# Patient Record
Sex: Female | Born: 1960
Health system: Southern US, Community
[De-identification: ages and names within clinical notes are randomized; demographics above are authoritative.]

## PROBLEM LIST (undated history)

## (undated) DIAGNOSIS — J4520 Mild intermittent asthma, uncomplicated: Secondary | ICD-10-CM

## (undated) DIAGNOSIS — J309 Allergic rhinitis, unspecified: Secondary | ICD-10-CM

## (undated) DIAGNOSIS — E7800 Pure hypercholesterolemia, unspecified: Secondary | ICD-10-CM

## (undated) DIAGNOSIS — J45909 Unspecified asthma, uncomplicated: Secondary | ICD-10-CM

## (undated) DIAGNOSIS — I10 Essential (primary) hypertension: Secondary | ICD-10-CM

## (undated) DIAGNOSIS — R7303 Prediabetes: Secondary | ICD-10-CM

## (undated) DIAGNOSIS — E782 Mixed hyperlipidemia: Secondary | ICD-10-CM

## (undated) HISTORY — DX: Mild intermittent asthma, uncomplicated: J45.20

## (undated) HISTORY — DX: Essential (primary) hypertension: I10

## (undated) HISTORY — DX: Prediabetes: R73.03

## (undated) HISTORY — PX: SHOULDER ARTHROSCOPY: SHX128

## (undated) HISTORY — DX: Allergic rhinitis, unspecified: J30.9

## (undated) HISTORY — PX: HAMMER TOE SURGERY: SHX385

## (undated) HISTORY — DX: Unspecified asthma, uncomplicated: J45.909

## (undated) HISTORY — PX: CHOLECYSTECTOMY: SHX55

## (undated) HISTORY — DX: Pure hypercholesterolemia, unspecified: E78.00

## (undated) HISTORY — DX: Mixed hyperlipidemia: E78.2

## (undated) HISTORY — PX: BUNIONECTOMY: SHX129

---

## 2002-12-01 ENCOUNTER — Emergency Department (HOSPITAL_COMMUNITY): Admission: EM | Admit: 2002-12-01 | Discharge: 2002-12-01 | Payer: Self-pay | Admitting: Emergency Medicine

## 2002-12-01 ENCOUNTER — Encounter: Payer: Self-pay | Admitting: Emergency Medicine

## 2005-04-11 ENCOUNTER — Emergency Department (HOSPITAL_COMMUNITY): Admission: EM | Admit: 2005-04-11 | Discharge: 2005-04-11 | Payer: Self-pay | Admitting: Family Medicine

## 2015-11-23 LAB — HM COLONOSCOPY

## 2016-12-25 LAB — HM MAMMOGRAPHY

## 2017-05-25 ENCOUNTER — Ambulatory Visit: Payer: Self-pay | Admitting: Allergy

## 2017-06-25 ENCOUNTER — Encounter: Payer: Self-pay | Admitting: Allergy

## 2017-06-25 ENCOUNTER — Ambulatory Visit (INDEPENDENT_AMBULATORY_CARE_PROVIDER_SITE_OTHER): Payer: Self-pay | Admitting: Allergy

## 2017-06-25 VITALS — BP 110/70 | HR 80 | Temp 98.3°F | Resp 16 | Ht 68.25 in | Wt 164.0 lb

## 2017-06-25 DIAGNOSIS — J3089 Other allergic rhinitis: Secondary | ICD-10-CM

## 2017-06-25 DIAGNOSIS — Z91018 Allergy to other foods: Secondary | ICD-10-CM

## 2017-06-25 DIAGNOSIS — J4530 Mild persistent asthma, uncomplicated: Secondary | ICD-10-CM

## 2017-06-25 DIAGNOSIS — K219 Gastro-esophageal reflux disease without esophagitis: Secondary | ICD-10-CM

## 2017-06-25 MED ORDER — BUDESONIDE-FORMOTEROL FUMARATE 80-4.5 MCG/ACT IN AERO: 2.0000 | INHALATION_SPRAY | Freq: Two times a day (BID) | RESPIRATORY_TRACT | 5 refills | Status: DC

## 2017-06-25 MED ORDER — ALBUTEROL SULFATE HFA 108 (90 BASE) MCG/ACT IN AERS: 2.0000 | INHALATION_SPRAY | RESPIRATORY_TRACT | 4 refills | Status: DC | PRN

## 2017-06-25 MED ORDER — AZELASTINE HCL 0.1 % NA SOLN: 2.0000 | Freq: Two times a day (BID) | NASAL | 4 refills | Status: DC

## 2017-06-25 NOTE — Progress Notes (Signed)
New Patient Note  RE: Brittany Abbott MRN: [id number] DOB: 1960-11-27 Date of Office Visit: 06/25/2017  Referring provider: No ref. provider found Primary care provider: Patient, No Pcp Per  Chief Complaint: cough  History of present illness: Brittany Abbott is a 56 y.o. female presenting today for evaluation of cough for years.  She has a history of asthma, allergic rhinitis and food allergy.  She is a former patient of Dr. Ishmael Holter however she has not been seen in over 3 years.  She reports past 1.5 year cough has been worse.  Cough is during day and night.  Sometimes cough is productive but most time it is dry cough.  She reports she does get into "spasms" of cough where it seems like she can't stop.  She denies any wheezing or chest tightness.  She does throat clear and states she has a lot of PND.  She takes Symbicort (and goes back and forth between Advair) now for the past year.  She takes 1 puff twice a day.  She does have an albuterol inhaler and reports symptoms were worse in July and used about 23-24 puffs over the course of the month.   She does wake up from sleep with cough most nights and can wake multiple times a night.      She feels the antihistamines she use to take dried her out too much (zyrtec, allegra, claritin) thus she tries to avoid taking these medications.  She denies significant nasal congestion.  She reports she has used nasal saline rinse before.  She has used nasocort and flonase nasal sprays states that she could taste them.  She has used astelin many many years ago.   She recalls being allergic to grass, mold, dust, trees (pine, oak) in the past.    She has been paying attention to her foods as she feels she may have some reflux components.  She doesn't eat after 7:30.  She also reports cold impacts her reflux symptoms thus she does not drink any beverages with ice.  She has a shellfish allergy and avoids lobster, shrimp, crab.  She is able to eat oysters without any issue.   She states her previous crustacean ingestion she has developed lip itchiness.  She recalls her testing to the crustaceans to be a 3 or 4+ on skin testing. She has a very outdated epinephrine device.   She moved back here to [place] about a year ago from [place].  She travels a lot with her work across the country.    Review of systems: Review of Systems  Constitutional: Negative for chills, fever and malaise/fatigue.  HENT: Negative for congestion, ear discharge, ear pain, nosebleeds, sinus pain, sore throat and tinnitus.   Eyes: Negative for pain, discharge and redness.  Respiratory: Positive for cough and sputum production. Negative for shortness of breath and wheezing.   Cardiovascular: Negative for chest pain.  Gastrointestinal: Positive for heartburn. Negative for abdominal pain, constipation, diarrhea, nausea and vomiting.  Musculoskeletal: Negative for joint pain.  Skin: Negative for itching and rash.  Neurological: Negative for headaches.    All other systems negative unless noted above in HPI  Past medical history: Past Medical History:  Diagnosis Date  . Allergic rhinitis   . Asthma     Past surgical history: Past Surgical History:  Procedure Laterality Date  . BUNIONECTOMY    . CHOLECYSTECTOMY    . HAMMER TOE SURGERY      Family history:  Family History  Problem Relation Age of Onset  . Allergic rhinitis Neg Hx   . Asthma Neg Hx   . Eczema Neg Hx     Social history: She lives in a town house with carpeting with gas and electric heating and central cooling. There are no dogs inside the house. There is a dog outside the house. There is no concern for water damage, mildew or roaches in the home. She works as an Architectural technologist. She has no smoking history.   Medication List: Allergies as of 06/25/2017      Reactions   Shellfish Allergy Anaphylaxis      Medication List       Accurate as of 06/25/17 12:24 PM. Always use your  most recent med list.          albuterol 108 (90 Base) MCG/ACT inhaler Commonly known as:  VENTOLIN HFA Inhale 2 puffs into the lungs every 4 (four) hours as needed for wheezing or shortness of breath.   azelastine 0.1 % nasal spray Commonly known as:  ASTELIN Place 2 sprays into both nostrils 2 (two) times daily. Use in each nostril as directed   budesonide-formoterol 80-4.5 MCG/ACT inhaler Commonly known as:  SYMBICORT Inhale 2 puffs into the lungs 2 (two) times daily.   triamterene-hydrochlorothiazide 37.5-25 MG tablet Commonly known as:  MAXZIDE-25 Take 1 tablet by mouth daily.       Known medication allergies: Allergies  Allergen Reactions  . Shellfish Allergy Anaphylaxis     Physical examination: Blood pressure 110/70, pulse 80, temperature 98.3 F (36.8 C), temperature source Oral, resp. rate 16, height 5' 8.25" (1.734 m), weight 164 lb (74.4 kg).  General: Alert, interactive, in no acute distress. HEENT: PERRLA, TMs pearly gray, turbinates mildly edematous with crusty discharge, post-pharynx non erythematous. Neck: Supple without lymphadenopathy. Lungs: Clear to auscultation without wheezing, rhonchi or rales. {no increased work of breathing. CV: Normal S1, S2 without murmurs. Abdomen: Nondistended, nontender. Skin: Warm and dry, without lesions or rashes. Extremities:  No clubbing, cyanosis or edema. Neuro:   Grossly intact.  Diagnositics/Labs:  Spirometry: FEV1: 2.67L  110%, FVC: 3.08L  102%, ratio consistent with Nonobstructive pattern  Allergy testing: Environmental skin prick testing was positive for [place] blue grass, Timothy grass, dust mites and cockroach. Allergy testing results were read and interpreted by provider, documented by clinical staff.   Assessment and plan:   Cough with history of asthma    - current cough likely multifactorial with Post-nasal drainage, reflux and asthma.      - increase Symbicort to 2 puffs twice a day    -  have access to albuterol inhaler 2 puffs every 4-6 hours as needed for cough/wheeze/shortness of breath/chest tightness.  May use 15-20 minutes prior to activity.   Monitor frequency of use.    Asthma control goals:   Full participation in all desired activities (may need albuterol before activity)  Albuterol use two time or less a week on average (not counting use with activity)  Cough interfering with sleep two time or less a month  Oral steroids no more than once a year  No hospitalizations  Allergic rhinitis     - likely large component of cough     - environmental allergy testing is positive for grasses, dust mites and cockroach.  Allergen avoidance measures discussed and dust mite cover info provided.      - trial use of Dymista (combo spray with Flonast and Astelin) 1 spray each nostril twice a  day.  Once this run out use Astelin separately.       - recommend use of Astelin (nasal antihistamine) 2 sprays each nostril for control of nasal drainage  Food Allergy    - continue avoidance of shellfish (crustaceans- shrimp, lobster, crab)    - have access to self-injectable epinephrine Brittany Abbott) 0.3mg  at all times    - follow emergency action plan in case of allergic reaction  Reflux    - continue lifestyle modifications    - she would like to minimize amount of medications she takes and will do above measures and if remains with cough will initiate anti-reflux medication  Follow-up 4-6 months-- Let us know how symptoms are doing by phone  I appreciate the opportunity to take part in Abagael's care. Please do not hesitate to contact me with questions.  Sincerely,   Brittany Feeler, MD Allergy/Immunology [facility]

## 2017-06-25 NOTE — Patient Instructions (Addendum)
Cough with history of asthma    - current cough likely multifactorial with Post-nasal drainage, reflux and asthma.      - increase Symbicort to 2 puffs twice a day    - have access to albuterol inhaler 2 puffs every 4-6 hours as needed for cough/wheeze/shortness of breath/chest tightness.  May use 15-20 minutes prior to activity.   Monitor frequency of use.    Asthma control goals:   Full participation in all desired activities (may need albuterol before activity)  Albuterol use two time or less a week on average (not counting use with activity)  Cough interfering with sleep two time or less a month  Oral steroids no more than once a year  No hospitalizations  Allergic rhinitis     - likely large component of cough     - environmental allergy testing is positive for grasses, dust mites and cockroach.  Allergen avoidance measures discussed and dust mite cover info provided.      - trial use of Dymista (combo spray with Flonast and Astelin) 1 spray each nostril twice a day.  Once this run out use Astelin separately.       - recommend use of Astelin (nasal antihistamine) 2 sprays each nostril for control of nasal drainage  Food Allergy    - continue avoidance of shellfish (crustaceans- shrimp, lobster, crab)    - have access to self-injectable epinephrine Wynona Luna) 0.3mg  at all times    - follow emergency action plan in case of allergic reaction  Follow-up 4-6 months-- Let us know how symptoms are doing by phone

## 2017-07-16 ENCOUNTER — Encounter: Payer: Self-pay | Admitting: Family Medicine

## 2017-07-16 ENCOUNTER — Ambulatory Visit (INDEPENDENT_AMBULATORY_CARE_PROVIDER_SITE_OTHER): Payer: No Typology Code available for payment source | Admitting: Family Medicine

## 2017-07-16 VITALS — BP 120/72 | HR 72 | Ht 68.5 in | Wt 160.2 lb

## 2017-07-16 DIAGNOSIS — E782 Mixed hyperlipidemia: Secondary | ICD-10-CM

## 2017-07-16 DIAGNOSIS — Z87898 Personal history of other specified conditions: Secondary | ICD-10-CM | POA: Diagnosis not present

## 2017-07-16 DIAGNOSIS — J4520 Mild intermittent asthma, uncomplicated: Secondary | ICD-10-CM | POA: Diagnosis not present

## 2017-07-16 DIAGNOSIS — E7800 Pure hypercholesterolemia, unspecified: Secondary | ICD-10-CM

## 2017-07-16 DIAGNOSIS — I10 Essential (primary) hypertension: Secondary | ICD-10-CM

## 2017-07-16 HISTORY — DX: Pure hypercholesterolemia, unspecified: E78.00

## 2017-07-16 MED ORDER — TRIAMTERENE-HCTZ 37.5-25 MG PO TABS: 1.0000 | ORAL_TABLET | Freq: Every day | ORAL | 1 refills | Status: DC

## 2017-07-16 NOTE — Progress Notes (Signed)
   Subjective:    Patient ID: Brittany Abbott, female    DOB: 1961-02-24, 56 y.o.   MRN: [id number]  HPI Chief Complaint  Patient presents with  . new pt    new pt, get established. no other cocerns. declines flu shot. needs refill on bp med. had recent lab work done at obgyn Dr. Nyoka Cowden   She is new to the practice and here to establish care. States she moved here in August 2017 from [place]. Dr. Ola Spurr in [place] was her PCP.  States she had an exam and labs done at Dr. Rolly Salter office in January 2018.   Last CPE: January 2017.   Other providers: Dr. Nyoka Cowden is her OB/GYN. Dr. Nelva Bush is allergist.   Past medical history: HTN- diagnosed at age 28. Well controlled.  Mixed hyperlipidemia  Pre-diabetes in January 2017.  Asthma- diagnosed in HS. Exercise induced and allergies. No asthma flares in over a year.   Social history: Lives alone. Divorced. Works as an Occupational hygienist and travels for work.  Diet: reports healthy diet. She likes to bake  Excerise: some days   Health maintenance:  Mammogram: up to date Colonoscopy: due in 2020 Last Gynecological Exam: up to date Last Menstrual cycle: irregular and started 2 days ago.   Depression screen PHQ 2/9 07/16/2017  Decreased Interest 0  Down, Depressed, Hopeless 0  PHQ - 2 Score 0    Reviewed allergies, medications, past medical, surgical, family, and social history.   Review of Systems Pertinent positives and negatives in the history of present illness.     Objective:   Physical Exam BP 120/72   Pulse 72   Ht 5' 8.5" (1.74 m)   Wt 160 lb 3.2 oz (72.7 kg)   LMP 07/14/2017   BMI 24.00 kg/m   Alert and oriented and in no acute distress. Not otherwise examined.       Assessment & Plan:  Essential hypertension - Plan: triamterene-hydrochlorothiazide (MAXZIDE-25) 37.5-25 MG tablet  Mixed hyperlipidemia  History of prediabetes  Mild intermittent asthma without complication   Discussed that her BP is well  controlled, no issues or concerns regarding medication, medication refilled.  Asthma appears to be well controlled. She is compliant with medication. No recent flares. She will continue seeing her allergist.  Discussed healthy diet and exercise for HTN, prediabetes and hyperlipidemia. She has never taken medication for cholesterol. Needs to have this repeated.  She would like to return in the next few weeks for a fasting CPE.

## 2017-07-20 ENCOUNTER — Encounter: Payer: Self-pay | Admitting: Family Medicine

## 2017-10-08 ENCOUNTER — Other Ambulatory Visit: Payer: Self-pay | Admitting: Allergy

## 2017-10-08 DIAGNOSIS — J3089 Other allergic rhinitis: Secondary | ICD-10-CM

## 2017-10-08 MED ORDER — AZELASTINE HCL 0.1 % NA SOLN: 2.0000 | Freq: Two times a day (BID) | NASAL | 0 refills | Status: DC

## 2017-10-08 NOTE — Telephone Encounter (Signed)
Received fax for 90 day supply for azelastine. Patient was last seen on 06/25/2017. Refill sent in.

## 2017-11-06 HISTORY — PX: ROTATOR CUFF REPAIR: SHX139

## 2017-11-24 ENCOUNTER — Emergency Department: Payer: PRIVATE HEALTH INSURANCE

## 2017-11-24 ENCOUNTER — Encounter: Payer: Self-pay | Admitting: Emergency Medicine

## 2017-11-24 ENCOUNTER — Emergency Department
Admission: EM | Admit: 2017-11-24 | Discharge: 2017-11-24 | Disposition: A | Discharge status: Home / Self Care | Payer: PRIVATE HEALTH INSURANCE | Attending: Emergency Medicine | Admitting: Emergency Medicine

## 2017-11-24 DIAGNOSIS — M7918 Myalgia, other site: Secondary | ICD-10-CM | POA: Insufficient documentation

## 2017-11-24 DIAGNOSIS — M542 Cervicalgia: Secondary | ICD-10-CM | POA: Diagnosis present

## 2017-11-24 DIAGNOSIS — I10 Essential (primary) hypertension: Secondary | ICD-10-CM | POA: Diagnosis not present

## 2017-11-24 DIAGNOSIS — Z79899 Other long term (current) drug therapy: Secondary | ICD-10-CM | POA: Insufficient documentation

## 2017-11-24 DIAGNOSIS — M5412 Radiculopathy, cervical region: Secondary | ICD-10-CM

## 2017-11-24 DIAGNOSIS — J45909 Unspecified asthma, uncomplicated: Secondary | ICD-10-CM | POA: Diagnosis not present

## 2017-11-24 MED ORDER — CYCLOBENZAPRINE HCL 10 MG PO TABS: 10.0000 mg | ORAL_TABLET | Freq: Three times a day (TID) | ORAL | 0 refills | Status: DC | PRN

## 2017-11-24 MED ORDER — NAPROXEN 500 MG PO TABS: 500.0000 mg | ORAL_TABLET | Freq: Two times a day (BID) | ORAL | 0 refills | Status: DC

## 2017-11-24 NOTE — Discharge Instructions (Signed)
Please follow up with your primary care provider if you are not improving over the week.  Return to the ER for symptoms that change or worsen if unable to schedule an appointment.
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
## 2017-11-26 ENCOUNTER — Telehealth: Payer: Self-pay
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
## 2017-11-27 ENCOUNTER — Ambulatory Visit (INDEPENDENT_AMBULATORY_CARE_PROVIDER_SITE_OTHER): Payer: No Typology Code available for payment source | Admitting: Family Medicine

## 2017-11-27 ENCOUNTER — Encounter: Payer: Self-pay | Admitting: Family Medicine

## 2017-11-27 VITALS — BP 130/84 | HR 76 | Ht 69.0 in | Wt 161.6 lb

## 2017-11-27 DIAGNOSIS — M25512 Pain in left shoulder: Secondary | ICD-10-CM

## 2017-11-27 DIAGNOSIS — M542 Cervicalgia: Secondary | ICD-10-CM | POA: Diagnosis not present

## 2017-11-27 DIAGNOSIS — M25511 Pain in right shoulder: Secondary | ICD-10-CM

## 2017-11-27 NOTE — Patient Instructions (Addendum)
Start the naproxen and take it twice daily with food (if it bothers your stomach, cut in half).  Do not use other OTC pain relieves, but Tylenol IS okay (the only one you can take along with this medication). It may take a few days for the naproxen to fully kick in.  Continue it until your pain has completely resolved, or up to 2 weeks. If you aren't better by 2 weeks, you may need physical therapy.  Consider [employer] for heat when you are driving (when you don't have access to a heating pad).  Do the range of motion exercises for the shoulders as shown. Use the muscle relaxant at bedtime if pain interferes with sleep. Use with extreme caution during the day (take only 1/2 tab, and don't drive until you know if it makes you sleepy).  You can use the muscle relaxant only if needed for severe pain in the muscle. Heat, massage, stretches will also help.  Hold each exercise for 10 seconds, repeat 10 times, and do them twice daily.  Shoulder Exercises Ask your health care provider which exercises are safe for you. Do exercises exactly as told by your health care provider and adjust them as directed. It is normal to feel mild stretching, pulling, tightness, or discomfort as you do these exercises, but you should stop right away if you feel sudden pain or your pain gets worse.Do not begin these exercises until told by your health care provider. RANGE OF MOTION EXERCISES These exercises warm up your muscles and joints and improve the movement and flexibility of your shoulder. These exercises also help to relieve pain, numbness, and tingling. These exercises involve stretching your injured shoulder directly. Exercise A: Pendulum  1. Stand near a wall or a surface that you can hold onto for balance. 2. Bend at the waist and let your left / right arm hang straight down. Use your other arm to support you. Keep your back straight and do not lock your knees. 3. Relax your left / right arm and shoulder  muscles, and move your hips and your trunk so your left / right arm swings freely. Your arm should swing because of the motion of your body, not because you are using your arm or shoulder muscles. 4. Keep moving your body so your arm swings in the following directions, as told by your health care provider: ? Side to side. ? Forward and backward. ? In clockwise and counterclockwise circles. 5. Continue each motion for __________ seconds, or for as long as told by your health care provider. 6. Slowly return to the starting position. Repeat __________ times. Complete this exercise __________ times a day. Exercise B:Flexion, Standing  1. Stand and hold a broomstick, a cane, or a similar object. Place your hands a little more than shoulder-width apart on the object. Your left / right hand should be palm-up, and your other hand should be palm-down. 2. Keep your elbow straight and keep your shoulder muscles relaxed. Push the stick down with your healthy arm to raise your left / right arm in front of your body, and then over your head until you feel a stretch in your shoulder. ? Avoid shrugging your shoulder while you raise your arm. Keep your shoulder blade tucked down toward the middle of your back. 3. Hold for __________ seconds. 4. Slowly return to the starting position. Repeat __________ times. Complete this exercise __________ times a day. Exercise C: Abduction, Standing 1. Stand and hold a broomstick, a cane,  or a similar object. Place your hands a little more than shoulder-width apart on the object. Your left / right hand should be palm-up, and your other hand should be palm-down. 2. While keeping your elbow straight and your shoulder muscles relaxed, push the stick across your body toward your left / right side. Raise your left / right arm to the side of your body and then over your head until you feel a stretch in your shoulder. ? Do not raise your arm above shoulder height, unless your health  care provider tells you to do that. ? Avoid shrugging your shoulder while you raise your arm. Keep your shoulder blade tucked down toward the middle of your back. 3. Hold for __________ seconds. 4. Slowly return to the starting position. Repeat __________ times. Complete this exercise __________ times a day. Exercise D:Internal Rotation  1. Place your left / right hand behind your back, palm-up. 2. Use your other hand to dangle an exercise band, a towel, or a similar object over your shoulder. Grasp the band with your left / right hand so you are holding onto both ends. 3. Gently pull up on the band until you feel a stretch in the front of your left / right shoulder. ? Avoid shrugging your shoulder while you raise your arm. Keep your shoulder blade tucked down toward the middle of your back. 4. Hold for __________ seconds. 5. Release the stretch by letting go of the band and lowering your hands. Repeat __________ times. Complete this exercise __________ times a day. STRETCHING EXERCISES These exercises warm up your muscles and joints and improve the movement and flexibility of your shoulder. These exercises also help to relieve pain, numbness, and tingling. These exercises are done using your healthy shoulder to help stretch the muscles of your injured shoulder. Exercise E: Warehouse manager (External Rotation and Abduction)  1. Stand in a doorway with one of your feet slightly in front of the other. This is called a staggered stance. If you cannot reach your forearms to the door frame, stand facing a corner of a room. 2. Choose one of the following positions as told by your health care provider: ? Place your hands and forearms on the door frame above your head. ? Place your hands and forearms on the door frame at the height of your head. ? Place your hands on the door frame at the height of your elbows. 3. Slowly move your weight onto your front foot until you feel a stretch across your chest  and in the front of your shoulders. Keep your head and chest upright and keep your abdominal muscles tight. 4. Hold for __________ seconds. 5. To release the stretch, shift your weight to your back foot. Repeat __________ times. Complete this stretch __________ times a day. Exercise F:Extension, Standing 1. Stand and hold a broomstick, a cane, or a similar object behind your back. ? Your hands should be a little wider than shoulder-width apart. ? Your palms should face away from your back. 2. Keeping your elbows straight and keeping your shoulder muscles relaxed, move the stick away from your body until you feel a stretch in your shoulder. ? Avoid shrugging your shoulders while you move the stick. Keep your shoulder blade tucked down toward the middle of your back. 3. Hold for __________ seconds. 4. Slowly return to the starting position. Repeat __________ times. Complete this exercise __________ times a day. STRENGTHENING EXERCISES These exercises build strength and endurance in your shoulder. Endurance  is the ability to use your muscles for a long time, even after they get tired. Exercise G:External Rotation  1. Sit in a stable chair without armrests. 2. Secure an exercise band at elbow height on your left / right side. 3. Place a soft object, such as a folded towel or a small pillow, between your left / right upper arm and your body to move your elbow a few inches away (about 10 cm) from your side. 4. Hold the end of the band so it is tight and there is no slack. 5. Keeping your elbow pressed against the soft object, move your left / right forearm out, away from your abdomen. Keep your body steady so only your forearm moves. 6. Hold for __________ seconds. 7. Slowly return to the starting position. Repeat __________ times. Complete this exercise __________ times a day. Exercise H:Shoulder Abduction  1. Sit in a stable chair without armrests, or stand. 2. Hold a __________ weight in  your left / right hand, or hold an exercise band with both hands. 3. Start with your arms straight down and your left / right palm facing in, toward your body. 4. Slowly lift your left / right hand out to your side. Do not lift your hand above shoulder height unless your health care provider tells you that this is safe. ? Keep your arms straight. ? Avoid shrugging your shoulder while you do this movement. Keep your shoulder blade tucked down toward the middle of your back. 5. Hold for __________ seconds. 6. Slowly lower your arm, and return to the starting position. Repeat __________ times. Complete this exercise __________ times a day. Exercise I:Shoulder Extension 1. Sit in a stable chair without armrests, or stand. 2. Secure an exercise band to a stable object in front of you where it is at shoulder height. 3. Hold one end of the exercise band in each hand. Your palms should face each other. 4. Straighten your elbows and lift your hands up to shoulder height. 5. Step back, away from the secured end of the exercise band, until the band is tight and there is no slack. 6. Squeeze your shoulder blades together as you pull your hands down to the sides of your thighs. Stop when your hands are straight down by your sides. Do not let your hands go behind your body. 7. Hold for __________ seconds. 8. Slowly return to the starting position. Repeat __________ times. Complete this exercise __________ times a day. Exercise J:Standing Shoulder Row 1. Sit in a stable chair without armrests, or stand. 2. Secure an exercise band to a stable object in front of you so it is at waist height. 3. Hold one end of the exercise band in each hand. Your palms should be in a thumbs-up position. 4. Bend each of your elbows to an "L" shape (about 90 degrees) and keep your upper arms at your sides. 5. Step back until the band is tight and there is no slack. 6. Slowly pull your elbows back behind you. 7. Hold for  __________ seconds. 8. Slowly return to the starting position. Repeat __________ times. Complete this exercise __________ times a day. Exercise K:Shoulder Press-Ups  1. Sit in a stable chair that has armrests. Sit upright, with your feet flat on the floor. 2. Put your hands on the armrests so your elbows are bent and your fingers are pointing forward. Your hands should be about even with the sides of your body. 3. Push down on the armrests  and use your arms to lift yourself off of the chair. Straighten your elbows and lift yourself up as much as you comfortably can. ? Move your shoulder blades down, and avoid letting your shoulders move up toward your ears. ? Keep your feet on the ground. As you get stronger, your feet should support less of your body weight as you lift yourself up. 4. Hold for __________ seconds. 5. Slowly lower yourself back into the chair. Repeat __________ times. Complete this exercise __________ times a day. Exercise L: Wall Push-Ups  1. Stand so you are facing a stable wall. Your feet should be about one arm-length away from the wall. 2. Lean forward and place your palms on the wall at shoulder height. 3. Keep your feet flat on the floor as you bend your elbows and lean forward toward the wall. 4. Hold for __________ seconds. 5. Straighten your elbows to push yourself back to the starting position. Repeat __________ times. Complete this exercise __________ times a day. This information is not intended to replace advice given to you by your health care provider. Make sure you discuss any questions you have with your health care provider. Document Released: 09/06/2005 Document Revised: 07/17/2016 Document Reviewed: 07/04/2015 Elsevier Interactive Patient Education  2018 [place].     Neck Exercises Neck exercises can be important for many reasons:  They can help you to improve and maintain flexibility in your neck. This can be especially important as you  age.  They can help to make your neck stronger. This can make movement easier.  They can reduce or prevent neck pain.  They may help your upper back.  Ask your health care provider which neck exercises would be best for you. Exercises Neck Press Repeat this exercise 10 times. Do it first thing in the morning and right before bed or as told by your health care provider. 1. Lie on your back on a firm bed or on the floor with a pillow under your head. 2. Use your neck muscles to push your head down on the pillow and straighten your spine. 3. Hold the position as well as you can. Keep your head facing up and your chin tucked. 4. Slowly count to 5 while holding this position. 5. Relax for a few seconds. Then repeat.  Isometric Strengthening Do a full set of these exercises 2 times a day or as told by your health care provider. 1. Sit in a supportive chair and place your hand on your forehead. 2. Push forward with your head and neck while pushing back with your hand. Hold for 10 seconds. 3. Relax. Then repeat the exercise 3 times. 4. Next, do thesequence again, this time putting your hand against the back of your head. Use your head and neck to push backward against the hand pressure. 5. Finally, do the same exercise on either side of your head, pushing sideways against the pressure of your hand.  Prone Head Lifts Repeat this exercise 5 times. Do this 2 times a day or as told by your health care provider. 1. Lie face-down, resting on your elbows so that your chest and upper back are raised. 2. Start with your head facing downward, near your chest. Position your chin either on or near your chest. 3. Slowly lift your head upward. Lift until you are looking straight ahead. Then continue lifting your head as far back as you can stretch. 4. Hold your head up for 5 seconds. Then slowly lower it to  your starting position.  Supine Head Lifts Repeat this exercise 8-10 times. Do this 2 times a day  or as told by your health care provider. 1. Lie on your back, bending your knees to point to the ceiling and keeping your feet flat on the floor. 2. Lift your head slowly off the floor, raising your chin toward your chest. 3. Hold for 5 seconds. 4. Relax and repeat.  Scapular Retraction Repeat this exercise 5 times. Do this 2 times a day or as told by your health care provider. 1. Stand with your arms at your sides. Look straight ahead. 2. Slowly pull both shoulders backward and downward until you feel a stretch between your shoulder blades in your upper back. 3. Hold for 10-30 seconds. 4. Relax and repeat.  Contact a health care provider if:  Your neck pain or discomfort gets much worse when you do an exercise.  Your neck pain or discomfort does not improve within 2 hours after you exercise. If you have any of these problems, stop exercising right away. Do not do the exercises again unless your health care provider says that you can. Get help right away if:  You develop sudden, severe neck pain. If this happens, stop exercising right away. Do not do the exercises again unless your health care provider says that you can. Exercises Neck Stretch  Repeat this exercise 3-5 times. 1. Do this exercise while standing or while sitting in a chair. 2. Place your feet flat on the floor, shoulder-width apart. 3. Slowly turn your head to the right. Turn it all the way to the right so you can look over your right shoulder. Do not tilt or tip your head. 4. Hold this position for 10-30 seconds. 5. Slowly turn your head to the left, to look over your left shoulder. 6. Hold this position for 10-30 seconds.  Neck Retraction Repeat this exercise 8-10 times. Do this 3-4 times a day or as told by your health care provider. 1. Do this exercise while standing or while sitting in a sturdy chair. 2. Look straight ahead. Do not bend your neck. 3. Use your fingers to push your chin backward. Do not bend  your neck for this movement. Continue to face straight ahead. If you are doing the exercise properly, you will feel a slight sensation in your throat and a stretch at the back of your neck. 4. Hold the stretch for 1-2 seconds. Relax and repeat.  This information is not intended to replace advice given to you by your health care provider. Make sure you discuss any questions you have with your health care provider. Document Released: 10/04/2015 Document Revised: 03/30/2016 Document Reviewed: 05/03/2015 Elsevier Interactive Patient Education  [employer].

## 2017-12-12 ENCOUNTER — Ambulatory Visit (INDEPENDENT_AMBULATORY_CARE_PROVIDER_SITE_OTHER): Payer: No Typology Code available for payment source | Admitting: Family Medicine

## 2017-12-12 ENCOUNTER — Encounter: Payer: Self-pay | Admitting: Family Medicine

## 2017-12-12 VITALS — BP 122/86 | HR 80 | Ht 69.0 in | Wt 162.0 lb

## 2017-12-12 DIAGNOSIS — M62838 Other muscle spasm: Secondary | ICD-10-CM | POA: Diagnosis not present

## 2017-12-12 DIAGNOSIS — M25511 Pain in right shoulder: Secondary | ICD-10-CM | POA: Diagnosis not present

## 2017-12-12 DIAGNOSIS — M542 Cervicalgia: Secondary | ICD-10-CM

## 2017-12-12 DIAGNOSIS — M25512 Pain in left shoulder: Secondary | ICD-10-CM

## 2017-12-12 DIAGNOSIS — G5602 Carpal tunnel syndrome, left upper limb: Secondary | ICD-10-CM | POA: Diagnosis not present

## 2017-12-12 MED ORDER — METAXALONE 800 MG PO TABS: 400.0000 mg | ORAL_TABLET | Freq: Three times a day (TID) | ORAL | 0 refills | Status: DC | PRN

## 2017-12-12 MED ORDER — NAPROXEN 500 MG PO TABS: 500.0000 mg | ORAL_TABLET | Freq: Two times a day (BID) | ORAL | 0 refills | Status: DC
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
## 2017-12-12 NOTE — Patient Instructions (Signed)
Continue to use the naproxen twice daily with food until getting relief (for another 2 weeks at most). Stop if it starts upsetting your stomach. Continue heat, massage and stretches. We discussed proper posture--avoid looking down. There may be a component of carpal tunnel syndrome (mostly noted on the left during the exam)--I recommend a trial of the wrist braces (found at any pharmacy). We are changing the muscle relaxant to one that is less sedating than the flexeril.  Use with caution, until you know how it makes you feel. We are referring you to physical therapy at [facility].  If you don't get a phone call in the next couple of days, you can either call there directly, or let us know and we can help.  If there is somewhere more convenient for you, just let us know the name/number.  If you have persistent pain, we may need to send you to an orthopedist for further evaluation. Wrist braces, poss CTS  \\ Carpal Tunnel Syndrome Carpal tunnel syndrome is a condition that causes pain in your hand and arm. The carpal tunnel is a narrow area located on the palm side of your wrist. Repeated wrist motion or certain diseases may cause swelling within the tunnel. This swelling pinches the main nerve in the wrist (median nerve). What are the causes? This condition may be caused by:  Repeated wrist motions.  Wrist injuries.  Arthritis.  A cyst or tumor in the carpal tunnel.  Fluid buildup during pregnancy.  Sometimes the cause of this condition is not known. What increases the risk? This condition is more likely to develop in:  People who have jobs that cause them to repeatedly move their wrists in the same motion, such as Art gallery manager.  Women.  People with certain conditions, such as: ? Diabetes. ? Obesity. ? An underactive thyroid (hypothyroidism). ? Kidney failure.  What are the signs or symptoms? Symptoms of this condition include:  A tingling feeling in your  fingers, especially in your thumb, index, and middle fingers.  Tingling or numbness in your hand.  An aching feeling in your entire arm, especially when your wrist and elbow are bent for long periods of time.  Wrist pain that goes up your arm to your shoulder.  Pain that goes down into your palm or fingers.  A weak feeling in your hands. You may have trouble grabbing and holding items.  Your symptoms may feel worse during the night. How is this diagnosed? This condition is diagnosed with a medical history and physical exam. You may also have tests, including:  An electromyogram (EMG). This test measures electrical signals sent by your nerves into the muscles.  X-rays.  How is this treated? Treatment for this condition includes:  Lifestyle changes. It is important to stop doing or modify the activity that caused your condition.  Physical or occupational therapy.  Medicines for pain and inflammation. This may include medicine that is injected into your wrist.  A wrist splint.  Surgery.  Follow these instructions at home: If you have a splint:  Wear it as told by your health care provider. Remove it only as told by your health care provider.  Loosen the splint if your fingers become numb and tingle, or if they turn cold and blue.  Keep the splint clean and dry. General instructions  Take over-the-counter and prescription medicines only as told by your health care provider.  Rest your wrist from any activity that may be causing your pain.  If your condition is work related, talk to your employer about changes that can be made, such as getting a wrist pad to use while typing.  If directed, apply ice to the painful area: ? Put ice in a plastic bag. ? Place a towel between your skin and the bag. ? Leave the ice on for 20 minutes, 2-3 times per day.  Keep all follow-up visits as told by your health care provider. This is important.  Do any exercises as told by your health  care provider, physical therapist, or occupational therapist. Contact a health care provider if:  You have new symptoms.  Your pain is not controlled with medicines.  Your symptoms get worse. This information is not intended to replace advice given to you by your health care provider. Make sure you discuss any questions you have with your health care provider. Document Released: 10/20/2000 Document Revised: 03/02/2016 Document Reviewed: 03/10/2015 Elsevier Interactive Patient Education  [employer].

## 2017-12-24 ENCOUNTER — Telehealth: Payer: Self-pay | Admitting: Family Medicine

## 2017-12-24 NOTE — Telephone Encounter (Signed)
No.  This is not to be a chronic med.  It is for an acute problem. Is pt requesting refill, or just [facility] sending request for [facility]?  She was referred for PT--not sure if she is getting or not.  Given muscle relaxant and PT referral (and naproxen refill) at her last visit.

## 2017-12-24 NOTE — Telephone Encounter (Signed)
Patient did not request this-[facility] did.

## 2017-12-24 NOTE — Telephone Encounter (Signed)
[facility] requested Naproxen 500 mg tablet #90

## 2017-12-26 ENCOUNTER — Ambulatory Visit: Payer: 59 | Attending: Family Medicine | Admitting: Physical Therapy

## 2017-12-26 ENCOUNTER — Other Ambulatory Visit: Payer: Self-pay

## 2017-12-26 ENCOUNTER — Encounter: Payer: Self-pay | Admitting: Physical Therapy

## 2017-12-26 DIAGNOSIS — M25512 Pain in left shoulder: Secondary | ICD-10-CM | POA: Insufficient documentation

## 2017-12-26 DIAGNOSIS — M6281 Muscle weakness (generalized): Secondary | ICD-10-CM | POA: Insufficient documentation

## 2017-12-26 DIAGNOSIS — R293 Abnormal posture: Secondary | ICD-10-CM

## 2017-12-26 DIAGNOSIS — M542 Cervicalgia: Secondary | ICD-10-CM | POA: Diagnosis not present

## 2017-12-26 DIAGNOSIS — M25511 Pain in right shoulder: Secondary | ICD-10-CM | POA: Diagnosis present

## 2017-12-26 NOTE — Patient Instructions (Signed)
  Sitting  Neck: Retraction   Sit with back and head straight. Pull chin back to line up ear with shoulder. Do not turn or tilt head. You can use your hand to help if needed. Hold _5___ seconds. Repeat _10___ times. Do __2-4__ sessions per day. CAUTION: Movement should be gentle, steady and slow.  Copyright  VHI. All rights reserved.    Roll   Inhale and bring shoulders up, back, then exhale and relax shoulders down. Repeat _10__ times. Do _3-5__ times per day.  Copyright  VHI. All rights reserved.
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
## 2017-12-28 ENCOUNTER — Ambulatory Visit (INDEPENDENT_AMBULATORY_CARE_PROVIDER_SITE_OTHER): Payer: No Typology Code available for payment source | Admitting: Family Medicine

## 2017-12-28 ENCOUNTER — Encounter: Payer: Self-pay | Admitting: Family Medicine

## 2017-12-28 VITALS — BP 122/80 | HR 60 | Ht 69.0 in | Wt 161.8 lb

## 2017-12-28 DIAGNOSIS — E782 Mixed hyperlipidemia: Secondary | ICD-10-CM

## 2017-12-28 DIAGNOSIS — Z0000 Encounter for general adult medical examination without abnormal findings: Secondary | ICD-10-CM | POA: Diagnosis not present

## 2017-12-28 DIAGNOSIS — Z87898 Personal history of other specified conditions: Secondary | ICD-10-CM

## 2017-12-28 DIAGNOSIS — I10 Essential (primary) hypertension: Secondary | ICD-10-CM

## 2017-12-28 LAB — POCT URINALYSIS DIP (PROADVANTAGE DEVICE)
BILIRUBIN UA: NEGATIVE
BILIRUBIN UA: NEGATIVE mg/dL
GLUCOSE UA: NEGATIVE mg/dL
Leukocytes, UA: NEGATIVE
Nitrite, UA: NEGATIVE
Protein Ur, POC: NEGATIVE mg/dL
RBC UA: NEGATIVE
SPECIFIC GRAVITY, URINE: 1.03
UUROB: NEGATIVE
pH, UA: 6 (ref 5.0–8.0)

## 2017-12-28 LAB — POCT GLYCOSYLATED HEMOGLOBIN (HGB A1C): Hemoglobin A1C: 6

## 2017-12-28 NOTE — Patient Instructions (Addendum)
It was a pleasure seeing you today.  Call and check with your insurance regarding Shingrix (shingles vaccine).  We will put you on the list here in the office and you may also check at various pharmacies if you would like to see if they have this in stock.   Your hemoglobin A1c is 6.0%. Try cutting back more of carbohydrates (bread, pasta, rice, potatoes, sugar) and increase your physical activity. At least 150 minutes of vigorous exercise per week is recommended.   We will call you with your lab results.  Follow up in 6 months please.    Preventative Care for Adults - Female      MAINTAIN REGULAR HEALTH EXAMS:  A routine yearly physical is a good way to check in with your primary care provider about your health and preventive screening. It is also an opportunity to share updates about your health and any concerns you have, and receive a thorough all-over exam.   Most health insurance companies pay for at least some preventative services.  Check with your health plan for specific coverages.  WHAT PREVENTATIVE SERVICES DO WOMEN NEED?  Adult women should have their weight and blood pressure checked regularly.   Women age 70 and older should have their cholesterol levels checked regularly.  Women should be screened for cervical cancer with a Pap smear and pelvic exam beginning at age 9.  Breast cancer screening generally begins at age 75 with a mammogram and breast exam by your primary care provider.    Beginning at age 50 and continuing to age 9, women should be screened for colorectal cancer.  Certain people may need continued testing until age 39.  Updating vaccinations is part of preventative care.  Vaccinations help protect against diseases such as the flu.  Osteoporosis is a disease in which the bones lose minerals and strength as we age. Women ages 28 and over should discuss this with their caregivers, as should women after menopause who have other risk factors.  Lab tests are  generally done as part of preventative care to screen for anemia and blood disorders, to screen for problems with the kidneys and liver, to screen for bladder problems, to check blood sugar, and to check your cholesterol level.  Preventative services generally include counseling about diet, exercise, avoiding tobacco, drugs, excessive alcohol consumption, and sexually transmitted infections.    GENERAL RECOMMENDATIONS FOR GOOD HEALTH:  Healthy diet:  Eat a variety of foods, including fruit, vegetables, animal or vegetable protein, such as meat, fish, chicken, and eggs, or beans, lentils, tofu, and grains, such as rice.  Drink plenty of water daily.  Decrease saturated fat in the diet, avoid lots of red meat, processed foods, sweets, fast foods, and fried foods.  Exercise:  Aerobic exercise helps maintain good heart health. At least 30-40 minutes of moderate-intensity exercise is recommended. For example, a brisk walk that increases your heart rate and breathing. This should be done on most days of the week.   Find a type of exercise or a variety of exercises that you enjoy so that it becomes a part of your daily life.  Examples are running, walking, swimming, water aerobics, and biking.  For motivation and support, explore group exercise such as aerobic class, spin class, Zumba, Yoga,or  martial arts, etc.    Set exercise goals for yourself, such as a certain weight goal, walk or run in a race such as a 5k walk/run.  Speak to your primary care provider about exercise  goals.  Disease prevention:  If you smoke or chew tobacco, find out from your caregiver how to quit. It can literally save your life, no matter how long you have been a tobacco user. If you do not use tobacco, never begin.   Maintain a healthy diet and normal weight. Increased weight leads to problems with blood pressure and diabetes.   The Body Mass Index or BMI is a way of measuring how much of your body is fat. Having a  BMI above 27 increases the risk of heart disease, diabetes, hypertension, stroke and other problems related to obesity. Your caregiver can help determine your BMI and based on it develop an exercise and dietary program to help you achieve or maintain this important measurement at a healthful level.  High blood pressure causes heart and blood vessel problems.  Persistent high blood pressure should be treated with medicine if weight loss and exercise do not work.   Fat and cholesterol leaves deposits in your arteries that can block them. This causes heart disease and vessel disease elsewhere in your body.  If your cholesterol is found to be high, or if you have heart disease or certain other medical conditions, then you may need to have your cholesterol monitored frequently and be treated with medication.   Ask if you should have a cardiac stress test if your history suggests this. A stress test is a test done on a treadmill that looks for heart disease. This test can find disease prior to there being a problem.  Menopause can be associated with physical symptoms and risks. Hormone replacement therapy is available to decrease these. You should talk to your caregiver about whether starting or continuing to take hormones is right for you.   Osteoporosis is a disease in which the bones lose minerals and strength as we age. This can result in serious bone fractures. Risk of osteoporosis can be identified using a bone density scan. Women ages 27 and over should discuss this with their caregivers, as should women after menopause who have other risk factors. Ask your caregiver whether you should be taking a calcium supplement and Vitamin D, to reduce the rate of osteoporosis.   Avoid drinking alcohol in excess (more than two drinks per day).  Avoid use of street drugs. Do not share needles with anyone. Ask for professional help if you need assistance or instructions on stopping the use of alcohol, cigarettes,  and/or drugs.  Brush your teeth twice a day with fluoride toothpaste, and floss once a day. Good oral hygiene prevents tooth decay and gum disease. The problems can be painful, unattractive, and can cause other health problems. Visit your dentist for a routine oral and dental check up and preventive care every 6-12 months.   Look at your skin regularly.  Use a mirror to look at your back. Notify your caregivers of changes in moles, especially if there are changes in shapes, colors, a size larger than a pencil eraser, an irregular border, or development of new moles.  Safety:  Use seatbelts 100% of the time, whether driving or as a passenger.  Use safety devices such as hearing protection if you work in environments with loud noise or significant background noise.  Use safety glasses when doing any work that could send debris in to the eyes.  Use a helmet if you ride a bike or motorcycle.  Use appropriate safety gear for contact sports.  Talk to your caregiver about gun safety.  Use sunscreen  with a SPF (or skin protection factor) of 15 or greater.  Lighter skinned people are at a greater risk of skin cancer. Don't forget to also wear sunglasses in order to protect your eyes from too much damaging sunlight. Damaging sunlight can accelerate cataract formation.   Practice safe sex. Use condoms. Condoms are used for birth control and to help reduce the spread of sexually transmitted infections (or STIs).  Some of the STIs are gonorrhea (the clap), chlamydia, syphilis, trichomonas, herpes, HPV (human papilloma virus) and HIV (human immunodeficiency virus) which causes AIDS. The herpes, HIV and HPV are viral illnesses that have no cure. These can result in disability, cancer and death.   Keep carbon monoxide and smoke detectors in your home functioning at all times. Change the batteries every 6 months or use a model that plugs into the wall.   Vaccinations:  Stay up to date with your tetanus shots and  other required immunizations. You should have a booster for tetanus every 10 years. Be sure to get your flu shot every year, since 5%-20% of the [place] population comes down with the flu. The flu vaccine changes each year, so being vaccinated once is not enough. Get your shot in the fall, before the flu season peaks.   Other vaccines to consider:  Human Papilloma Virus or HPV causes cancer of the cervix, and other infections that can be transmitted from person to person. There is a vaccine for HPV, and females should get immunized between the ages of 52 and 10. It requires a series of 3 shots.   Pneumococcal vaccine to protect against certain types of pneumonia.  This is normally recommended for adults age 62 or older.  However, adults younger than 56 years old with certain underlying conditions such as diabetes, heart or lung disease should also receive the vaccine.  Shingles vaccine to protect against Varicella Zoster if you are older than age 41, or younger than 56 years old with certain underlying illness.  Hepatitis A vaccine to protect against a form of infection of the liver by a virus acquired from food.  Hepatitis B vaccine to protect against a form of infection of the liver by a virus acquired from blood or body fluids, particularly if you work in health care.  If you plan to travel internationally, check with your local health department for specific vaccination recommendations.  Cancer Screening:  Breast cancer screening is essential to preventive care for women. All women age 76 and older should perform a breast self-exam every month. At age 37 and older, women should have their caregiver complete a breast exam each year. Women at ages 45 and older should have a mammogram (x-ray film) of the breasts. Your caregiver can discuss how often you need mammograms.    Cervical cancer screening includes taking a Pap smear (sample of cells examined under a microscope) from the cervix (end of the  uterus). It also includes testing for HPV (Human Papilloma Virus, which can cause cervical cancer). Screening and a pelvic exam should begin at age 29, or 3 years after a woman becomes sexually active. Screening should occur every year, with a Pap smear but no HPV testing, up to age 77. After age 55, you should have a Pap smear every 3 years with HPV testing, if no HPV was found previously.   Most routine colon cancer screening begins at the age of 71. On a yearly basis, doctors may provide special easy to use take-home tests to check  for hidden blood in the stool. Sigmoidoscopy or colonoscopy can detect the earliest forms of colon cancer and is life saving. These tests use a small camera at the end of a tube to directly examine the colon. Speak to your caregiver about this at age 58, when routine screening begins (and is repeated every 5 years unless early forms of pre-cancerous polyps or small growths are found).

## 2017-12-29 LAB — LIPID PANEL
CHOL/HDL RATIO: 3.4 ratio (ref 0.0–4.4)
Cholesterol, Total: 250 mg/dL — ABNORMAL HIGH (ref 100–199)
HDL: 73 mg/dL (ref >39)
LDL CALC: 156 mg/dL — AB (ref 0–99)
Triglycerides: 103 mg/dL (ref 0–149)
VLDL CHOLESTEROL CAL: 21 mg/dL (ref 5–40)

## 2017-12-29 LAB — COMPREHENSIVE METABOLIC PANEL
A/G RATIO: 2 (ref 1.2–2.2)
ALBUMIN: 4.8 g/dL (ref 3.5–5.5)
ALT: 15 IU/L (ref 0–32)
AST: 21 IU/L (ref 0–40)
Alkaline Phosphatase: 73 IU/L (ref 39–117)
BUN / CREAT RATIO: 16 (ref 9–23)
BUN: 13 mg/dL (ref 6–24)
Bilirubin Total: 0.4 mg/dL (ref 0.0–1.2)
CO2: 24 mmol/L (ref 20–29)
Calcium: 10.1 mg/dL (ref 8.7–10.2)
Chloride: 102 mmol/L (ref 96–106)
Creatinine, Ser: 0.82 mg/dL (ref 0.57–1.00)
GFR calc Af Amer: 93 mL/min/{1.73_m2} (ref >59)
GFR calc non Af Amer: 81 mL/min/{1.73_m2} (ref >59)
GLOBULIN, TOTAL: 2.4 g/dL (ref 1.5–4.5)
Glucose: 86 mg/dL (ref 65–99)
POTASSIUM: 4.7 mmol/L (ref 3.5–5.2)
SODIUM: 142 mmol/L (ref 134–144)
Total Protein: 7.2 g/dL (ref 6.0–8.5)

## 2017-12-29 LAB — CBC WITH DIFFERENTIAL/PLATELET
BASOS ABS: 0 10*3/uL (ref 0.0–0.2)
Basos: 1 %
EOS (ABSOLUTE): 0.3 10*3/uL (ref 0.0–0.4)
Eos: 6 %
HEMATOCRIT: 41.6 % (ref 34.0–46.6)
Hemoglobin: 14.1 g/dL (ref 11.1–15.9)
IMMATURE GRANS (ABS): 0 10*3/uL (ref 0.0–0.1)
Immature Granulocytes: 0 %
LYMPHS ABS: 1.9 10*3/uL (ref 0.7–3.1)
LYMPHS: 32 %
MCH: 31.3 pg (ref 26.6–33.0)
MCHC: 33.9 g/dL (ref 31.5–35.7)
MCV: 92 fL (ref 79–97)
MONOCYTES: 6 %
Monocytes Absolute: 0.3 10*3/uL (ref 0.1–0.9)
NEUTROS ABS: 3.3 10*3/uL (ref 1.4–7.0)
Neutrophils: 55 %
Platelets: 286 10*3/uL (ref 150–379)
RBC: 4.51 x10E6/uL (ref 3.77–5.28)
RDW: 14.2 % (ref 12.3–15.4)
WBC: 5.9 10*3/uL (ref 3.4–10.8)

## 2017-12-29 LAB — TSH: TSH: 0.812 u[IU]/mL (ref 0.450–4.500)

## 2017-12-31 ENCOUNTER — Encounter: Payer: Self-pay | Admitting: Family Medicine

## 2018-01-02 ENCOUNTER — Ambulatory Visit: Payer: 59 | Admitting: Physical Therapy

## 2018-01-03 ENCOUNTER — Encounter: Payer: Self-pay | Admitting: Physical Therapy

## 2018-01-03 ENCOUNTER — Ambulatory Visit: Payer: 59 | Admitting: Physical Therapy

## 2018-01-03 DIAGNOSIS — M25512 Pain in left shoulder: Secondary | ICD-10-CM

## 2018-01-03 DIAGNOSIS — M6281 Muscle weakness (generalized): Secondary | ICD-10-CM

## 2018-01-03 DIAGNOSIS — M542 Cervicalgia: Secondary | ICD-10-CM | POA: Diagnosis not present

## 2018-01-03 DIAGNOSIS — M25511 Pain in right shoulder: Secondary | ICD-10-CM

## 2018-01-03 DIAGNOSIS — R293 Abnormal posture: Secondary | ICD-10-CM
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
## 2018-01-03 NOTE — Patient Instructions (Addendum)
  Shoulder Retraction   Tie band around door knob (sitting or standing, holding band in both hands) Facing chest height anchor, grasp ends of band and pull hands to chest, squeezing shoulder blades together. Hold _3-5seconds. Repeat _10 times. Do _2_ sessions per day. Safety Note: Be sure anchor is secure.  Copyright  VHI. All rights reserved.  Strengthening: Resisted Extension   Hold band in both hands, Pull arm back, elbow straight, squeezing shoulder blades, Repeat _10___ times per set. Do _2___ sets per session. Do __1__ sessions per day.  http://orth.exer.us/833   Copyright  VHI. All rights reserved.     Roll   Inhale and bring shoulders up, back, then exhale and relax shoulders down. Repeat _10__ times. Do _2-3__ times per day.  Copyright  VHI. All rights reserved.    CHEST: Doorway, Bilateral - Standing    Standing in doorway, place hands on wall with hands at shoulder height.step forward until stretch is felt in front of chest. Hold __15-20_ seconds. __2_ reps per set, __2_ sets per day, ___5 days per week  Copyright  VHI. All rights reserved.  NECK TENSION: Assisted Stretch    Reach right arm around head and hold slightly above ear. Gently bring right ear toward right shoulder. Hold position for _15-20 sec. Repeat with other arm. Repeat _2-3__ times, alternating arms. Do _2__ times per day. *Be sure to keep other shoulder down by holding bottom of chair (ex- if tilting to the right, hold left shoulder down by holding bottom of chair on left side) Copyright  VHI. All rights reserved.

## 2018-01-07 ENCOUNTER — Encounter: Payer: Self-pay | Admitting: Physical Therapy

## 2018-01-07 ENCOUNTER — Ambulatory Visit: Payer: PRIVATE HEALTH INSURANCE | Attending: Family Medicine | Admitting: Physical Therapy

## 2018-01-07 DIAGNOSIS — M6281 Muscle weakness (generalized): Secondary | ICD-10-CM

## 2018-01-07 DIAGNOSIS — M25511 Pain in right shoulder: Secondary | ICD-10-CM | POA: Diagnosis present

## 2018-01-07 DIAGNOSIS — R293 Abnormal posture: Secondary | ICD-10-CM | POA: Diagnosis present

## 2018-01-07 DIAGNOSIS — M542 Cervicalgia: Secondary | ICD-10-CM

## 2018-01-07 DIAGNOSIS — M25512 Pain in left shoulder: Secondary | ICD-10-CM

## 2018-01-07 LAB — HM PAP SMEAR: HM PAP: NEGATIVE
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
## 2018-01-08 ENCOUNTER — Encounter: Payer: Self-pay | Admitting: Family Medicine

## 2018-01-11 ENCOUNTER — Other Ambulatory Visit: Payer: No Typology Code available for payment source

## 2018-01-12 ENCOUNTER — Other Ambulatory Visit: Payer: Self-pay | Admitting: Family Medicine

## 2018-01-12 DIAGNOSIS — I10 Essential (primary) hypertension: Secondary | ICD-10-CM

## 2018-01-13 ENCOUNTER — Other Ambulatory Visit: Payer: Self-pay | Admitting: Family Medicine

## 2018-01-13 DIAGNOSIS — M542 Cervicalgia: Secondary | ICD-10-CM

## 2018-01-13 DIAGNOSIS — M25512 Pain in left shoulder: Principal | ICD-10-CM

## 2018-01-13 DIAGNOSIS — M25511 Pain in right shoulder: Secondary | ICD-10-CM

## 2018-01-14 NOTE — Telephone Encounter (Signed)
She has since seen Vickie for CPE. I refilled it once more.  If any further refills, should go to her PCP

## 2018-01-14 NOTE — Telephone Encounter (Signed)
Patient did request this, still having pain right shoulder mostly. Is doing PT and it is helping-wanted to know if you are comfortable refilling for her.

## 2018-01-21 ENCOUNTER — Encounter: Payer: Self-pay | Admitting: Physical Therapy

## 2018-01-21 ENCOUNTER — Ambulatory Visit: Payer: PRIVATE HEALTH INSURANCE | Admitting: Physical Therapy

## 2018-01-21 DIAGNOSIS — M542 Cervicalgia: Secondary | ICD-10-CM

## 2018-01-21 DIAGNOSIS — M25512 Pain in left shoulder: Secondary | ICD-10-CM

## 2018-01-21 DIAGNOSIS — M25511 Pain in right shoulder: Secondary | ICD-10-CM

## 2018-01-21 DIAGNOSIS — M6281 Muscle weakness (generalized): Secondary | ICD-10-CM

## 2018-01-21 DIAGNOSIS — R293 Abnormal posture: Secondary | ICD-10-CM
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
## 2018-01-28 ENCOUNTER — Ambulatory Visit: Payer: PRIVATE HEALTH INSURANCE | Admitting: Physical Therapy

## 2018-02-04 ENCOUNTER — Encounter: Payer: PRIVATE HEALTH INSURANCE | Admitting: Physical Therapy

## 2018-02-11 ENCOUNTER — Ambulatory Visit: Payer: 59 | Attending: Family Medicine | Admitting: Physical Therapy

## 2018-02-11 ENCOUNTER — Encounter: Payer: Self-pay | Admitting: Physical Therapy

## 2018-02-11 DIAGNOSIS — M6281 Muscle weakness (generalized): Secondary | ICD-10-CM

## 2018-02-11 DIAGNOSIS — R293 Abnormal posture: Secondary | ICD-10-CM | POA: Diagnosis present

## 2018-02-11 DIAGNOSIS — M542 Cervicalgia: Secondary | ICD-10-CM | POA: Diagnosis not present

## 2018-02-11 DIAGNOSIS — M25512 Pain in left shoulder: Secondary | ICD-10-CM | POA: Insufficient documentation

## 2018-02-11 DIAGNOSIS — M25511 Pain in right shoulder: Secondary | ICD-10-CM | POA: Insufficient documentation
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
## 2018-02-18 ENCOUNTER — Ambulatory Visit
Admission: RE | Admit: 2018-02-18 | Discharge: 2018-02-18 | Disposition: A | Discharge status: Home / Self Care | Payer: Self-pay | Source: Ambulatory Visit | Attending: Family Medicine | Admitting: Family Medicine

## 2018-02-18 ENCOUNTER — Ambulatory Visit: Payer: 59

## 2018-02-18 ENCOUNTER — Encounter: Payer: Self-pay | Admitting: Family Medicine

## 2018-02-18 ENCOUNTER — Ambulatory Visit (INDEPENDENT_AMBULATORY_CARE_PROVIDER_SITE_OTHER): Payer: No Typology Code available for payment source | Admitting: Family Medicine

## 2018-02-18 VITALS — BP 120/86 | HR 72 | Ht 69.0 in | Wt 164.0 lb

## 2018-02-18 VITALS — BP 125/78 | HR 77

## 2018-02-18 DIAGNOSIS — M25511 Pain in right shoulder: Secondary | ICD-10-CM

## 2018-02-18 DIAGNOSIS — M6281 Muscle weakness (generalized): Secondary | ICD-10-CM

## 2018-02-18 DIAGNOSIS — M542 Cervicalgia: Secondary | ICD-10-CM | POA: Diagnosis not present

## 2018-02-18 DIAGNOSIS — M25512 Pain in left shoulder: Secondary | ICD-10-CM

## 2018-02-18 NOTE — Patient Instructions (Signed)
Go today to [facility] [phone] or [address] for x-ray of the right shoulder. I recommend that you see an orthopedist for further evaluation (and possible injection), and determination whether or not further imaging (ie MRI is indicated). Continue with PT and home exercises when you are unable to go to PT.  Let us know if you need any assistance with the orthopedic appointment.
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
## 2018-02-20 ENCOUNTER — Ambulatory Visit: Payer: 59

## 2018-02-20 DIAGNOSIS — M25511 Pain in right shoulder: Secondary | ICD-10-CM

## 2018-02-20 DIAGNOSIS — M542 Cervicalgia: Secondary | ICD-10-CM | POA: Diagnosis not present

## 2018-02-20 DIAGNOSIS — M25512 Pain in left shoulder: Secondary | ICD-10-CM
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
## 2018-02-21 ENCOUNTER — Telehealth: Payer: Self-pay | Admitting: Family Medicine

## 2018-02-21 ENCOUNTER — Other Ambulatory Visit: Payer: Self-pay | Admitting: Family Medicine

## 2018-02-21 DIAGNOSIS — M25512 Pain in left shoulder: Principal | ICD-10-CM

## 2018-02-21 DIAGNOSIS — M25511 Pain in right shoulder: Secondary | ICD-10-CM

## 2018-02-21 DIAGNOSIS — M542 Cervicalgia: Secondary | ICD-10-CM

## 2018-02-21 NOTE — Telephone Encounter (Signed)
Spoke with patient and she didn't realize how much the naproxen helped. Also she has an ortho appt on the 29th with Dr Rip Harbour.

## 2018-02-21 NOTE — Telephone Encounter (Signed)
Pt called and is requesting a refill on her Naproxen states she is having serve pain on her right shoulder and  Going down  To her  arm and to her hand,   and the tyenol is not helping, pt uses [id number] - [place], [place] pt can be reached at [phone]

## 2018-02-21 NOTE — Telephone Encounter (Signed)
She said at her visit that the naproxen didn't really help.  Okay to refill if she is willing to try again, but remind her to take with food.  The plan was for her to make appt with orthopedist for evaluation.  Please be sure she scheduled, and/or see if she needs assistance in getting appt with ortho.  Okay to send in refill

## 2018-02-25 ENCOUNTER — Ambulatory Visit: Payer: 59

## 2018-02-25 DIAGNOSIS — M542 Cervicalgia: Secondary | ICD-10-CM

## 2018-02-25 DIAGNOSIS — M25512 Pain in left shoulder: Secondary | ICD-10-CM
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
## 2018-03-04 ENCOUNTER — Encounter: Payer: PRIVATE HEALTH INSURANCE | Admitting: Physical Therapy

## 2018-03-13 DIAGNOSIS — Z0279 Encounter for issue of other medical certificate: Secondary | ICD-10-CM

## 2018-03-21 ENCOUNTER — Ambulatory Visit: Payer: 59 | Admitting: Physical Therapy

## 2018-03-28 ENCOUNTER — Ambulatory Visit: Payer: 59 | Attending: Family Medicine | Admitting: Physical Therapy

## 2018-03-28 ENCOUNTER — Encounter: Payer: Self-pay | Admitting: Physical Therapy

## 2018-03-28 DIAGNOSIS — M25511 Pain in right shoulder: Secondary | ICD-10-CM

## 2018-03-28 DIAGNOSIS — M25512 Pain in left shoulder: Secondary | ICD-10-CM | POA: Diagnosis present

## 2018-03-28 DIAGNOSIS — M542 Cervicalgia: Secondary | ICD-10-CM | POA: Diagnosis not present

## 2018-03-28 DIAGNOSIS — R293 Abnormal posture: Secondary | ICD-10-CM | POA: Diagnosis present

## 2018-03-28 DIAGNOSIS — M6281 Muscle weakness (generalized): Secondary | ICD-10-CM | POA: Diagnosis present
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
## 2018-04-08 ENCOUNTER — Ambulatory Visit: Payer: 59 | Attending: Family Medicine | Admitting: Physical Therapy

## 2018-04-08 ENCOUNTER — Encounter: Payer: Self-pay | Admitting: Physical Therapy

## 2018-04-08 DIAGNOSIS — M6281 Muscle weakness (generalized): Secondary | ICD-10-CM | POA: Diagnosis present

## 2018-04-08 DIAGNOSIS — M542 Cervicalgia: Secondary | ICD-10-CM | POA: Insufficient documentation

## 2018-04-08 DIAGNOSIS — M25512 Pain in left shoulder: Secondary | ICD-10-CM | POA: Insufficient documentation

## 2018-04-08 DIAGNOSIS — M25511 Pain in right shoulder: Secondary | ICD-10-CM | POA: Insufficient documentation

## 2018-04-08 DIAGNOSIS — R293 Abnormal posture: Secondary | ICD-10-CM | POA: Insufficient documentation
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
## 2018-04-10 ENCOUNTER — Ambulatory Visit: Payer: 59 | Admitting: Physical Therapy

## 2018-04-12 ENCOUNTER — Other Ambulatory Visit: Payer: Self-pay | Admitting: Allergy

## 2018-04-12 DIAGNOSIS — J3089 Other allergic rhinitis: Secondary | ICD-10-CM

## 2018-04-15 ENCOUNTER — Ambulatory Visit: Payer: 59 | Admitting: Physical Therapy

## 2018-04-15 ENCOUNTER — Encounter: Payer: Self-pay | Admitting: Physical Therapy

## 2018-04-15 DIAGNOSIS — M25512 Pain in left shoulder: Secondary | ICD-10-CM

## 2018-04-15 DIAGNOSIS — M542 Cervicalgia: Secondary | ICD-10-CM | POA: Diagnosis not present

## 2018-04-15 DIAGNOSIS — M25511 Pain in right shoulder: Secondary | ICD-10-CM

## 2018-04-15 DIAGNOSIS — M6281 Muscle weakness (generalized): Secondary | ICD-10-CM
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
## 2018-04-18 ENCOUNTER — Encounter: Payer: Self-pay | Admitting: Physical Therapy

## 2018-04-18 ENCOUNTER — Ambulatory Visit: Payer: 59 | Admitting: Physical Therapy

## 2018-04-18 DIAGNOSIS — M6281 Muscle weakness (generalized): Secondary | ICD-10-CM

## 2018-04-18 DIAGNOSIS — M542 Cervicalgia: Secondary | ICD-10-CM

## 2018-04-18 DIAGNOSIS — M25511 Pain in right shoulder: Secondary | ICD-10-CM

## 2018-04-18 DIAGNOSIS — M25512 Pain in left shoulder: Secondary | ICD-10-CM

## 2018-04-18 DIAGNOSIS — R293 Abnormal posture: Secondary | ICD-10-CM
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
## 2018-04-22 ENCOUNTER — Encounter: Payer: Self-pay | Admitting: Physical Therapy

## 2018-04-22 ENCOUNTER — Ambulatory Visit: Payer: 59 | Admitting: Physical Therapy

## 2018-04-22 DIAGNOSIS — M542 Cervicalgia: Secondary | ICD-10-CM

## 2018-04-22 DIAGNOSIS — M25511 Pain in right shoulder: Secondary | ICD-10-CM

## 2018-04-22 DIAGNOSIS — M6281 Muscle weakness (generalized): Secondary | ICD-10-CM

## 2018-04-22 DIAGNOSIS — R293 Abnormal posture: Secondary | ICD-10-CM
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
## 2018-04-24 ENCOUNTER — Ambulatory Visit: Payer: 59 | Admitting: Physical Therapy

## 2018-04-25 ENCOUNTER — Other Ambulatory Visit: Payer: Self-pay | Admitting: Allergy

## 2018-04-25 DIAGNOSIS — J3089 Other allergic rhinitis: Secondary | ICD-10-CM

## 2018-05-13 ENCOUNTER — Other Ambulatory Visit: Payer: Self-pay | Admitting: Allergy

## 2018-05-13 DIAGNOSIS — J3089 Other allergic rhinitis: Secondary | ICD-10-CM

## 2018-05-13 MED ORDER — AZELASTINE HCL 0.1 % NA SOLN: 2.0000 | Freq: Two times a day (BID) | NASAL | 0 refills | Status: DC

## 2018-05-13 NOTE — Telephone Encounter (Signed)
Patient is requesting a courtesy refill on her Astelin. She was last seen August 2018. She just had surgery and cannot make it in for a couple of weeks for an appointment. I made one for July 31. [facility] [place] [facility].

## 2018-05-13 NOTE — Telephone Encounter (Signed)
Refill has been sent.  °

## 2018-05-21 ENCOUNTER — Ambulatory Visit: Payer: 59 | Attending: Orthopedic Surgery | Admitting: Physical Therapy

## 2018-05-21 ENCOUNTER — Encounter: Payer: Self-pay | Admitting: Physical Therapy

## 2018-05-21 ENCOUNTER — Other Ambulatory Visit: Payer: Self-pay

## 2018-05-21 DIAGNOSIS — M25511 Pain in right shoulder: Secondary | ICD-10-CM | POA: Diagnosis not present

## 2018-05-21 DIAGNOSIS — M25611 Stiffness of right shoulder, not elsewhere classified: Secondary | ICD-10-CM | POA: Diagnosis present

## 2018-05-21 DIAGNOSIS — M6281 Muscle weakness (generalized): Secondary | ICD-10-CM | POA: Insufficient documentation
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
## 2018-05-23 ENCOUNTER — Ambulatory Visit: Payer: 59 | Admitting: Physical Therapy

## 2018-05-23 ENCOUNTER — Encounter: Payer: Self-pay | Admitting: Physical Therapy

## 2018-05-23 DIAGNOSIS — M25511 Pain in right shoulder: Secondary | ICD-10-CM

## 2018-05-23 DIAGNOSIS — M6281 Muscle weakness (generalized): Secondary | ICD-10-CM

## 2018-05-23 DIAGNOSIS — M25611 Stiffness of right shoulder, not elsewhere classified: Secondary | ICD-10-CM
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
## 2018-05-28 ENCOUNTER — Ambulatory Visit: Payer: 59 | Admitting: Physical Therapy

## 2018-05-28 ENCOUNTER — Encounter: Payer: Self-pay | Admitting: Physical Therapy

## 2018-05-28 DIAGNOSIS — M25511 Pain in right shoulder: Secondary | ICD-10-CM | POA: Diagnosis not present

## 2018-05-28 DIAGNOSIS — M25611 Stiffness of right shoulder, not elsewhere classified: Secondary | ICD-10-CM

## 2018-05-28 DIAGNOSIS — M6281 Muscle weakness (generalized): Secondary | ICD-10-CM
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
## 2018-06-03 ENCOUNTER — Ambulatory Visit: Payer: 59 | Admitting: Physical Therapy

## 2018-06-03 ENCOUNTER — Encounter: Payer: Self-pay | Admitting: Physical Therapy

## 2018-06-03 DIAGNOSIS — M25511 Pain in right shoulder: Secondary | ICD-10-CM

## 2018-06-03 DIAGNOSIS — M25611 Stiffness of right shoulder, not elsewhere classified: Secondary | ICD-10-CM

## 2018-06-03 DIAGNOSIS — M6281 Muscle weakness (generalized): Secondary | ICD-10-CM
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
## 2018-06-05 ENCOUNTER — Encounter: Payer: Self-pay | Admitting: Physical Therapy

## 2018-06-05 ENCOUNTER — Ambulatory Visit (INDEPENDENT_AMBULATORY_CARE_PROVIDER_SITE_OTHER): Payer: PRIVATE HEALTH INSURANCE | Admitting: Allergy

## 2018-06-05 ENCOUNTER — Encounter: Payer: Self-pay | Admitting: Allergy

## 2018-06-05 VITALS — BP 106/70 | HR 75 | Resp 16 | Ht 69.0 in | Wt 163.0 lb

## 2018-06-05 DIAGNOSIS — J4530 Mild persistent asthma, uncomplicated: Secondary | ICD-10-CM

## 2018-06-05 DIAGNOSIS — Z91018 Allergy to other foods: Secondary | ICD-10-CM

## 2018-06-05 DIAGNOSIS — J3089 Other allergic rhinitis: Secondary | ICD-10-CM | POA: Diagnosis not present

## 2018-06-05 MED ORDER — AZELASTINE HCL 0.1 % NA SOLN: 2.0000 | Freq: Two times a day (BID) | NASAL | 1 refills | Status: DC

## 2018-06-05 MED ORDER — EPINEPHRINE 0.3 MG/0.3ML IJ SOAJ: 0.3000 mg | Freq: Once | INTRAMUSCULAR | 2 refills | Status: AC

## 2018-06-05 MED ORDER — ALBUTEROL SULFATE HFA 108 (90 BASE) MCG/ACT IN AERS: 2.0000 | INHALATION_SPRAY | RESPIRATORY_TRACT | 4 refills | Status: DC | PRN

## 2018-06-05 NOTE — Patient Instructions (Signed)
Allergic rhinitis     - post-nasal drainage significant component of cough     - continue avoidance measures for grasses, dust mites and cockroach.     -  Continue use of  Astelin (nasal antihistamine) 2 sprays each nostril 1-2 times a day for control of nasal drainage    - continue zyrtec 10mg  daily as needed for allergy symptom control  Cough with history of asthma    - cough improved with management of post-nasal drainage as above    - use Symbicort 2 puffs twice a day during respiratory illnesses or asthma flares until symptoms improved    - have access to albuterol inhaler 2 puffs every 4-6 hours as needed for cough/wheeze/shortness of breath/chest tightness.  May use 15-20 minutes prior to activity.   Monitor frequency of use.    Asthma control goals:   Full participation in all desired activities (may need albuterol before activity)  Albuterol use two time or less a week on average (not counting use with activity)  Cough interfering with sleep two time or less a month  Oral steroids no more than once a year  No hospitalizations  Food Allergy    - continue avoidance of shellfish (crustaceans- shrimp, lobster, crab)    - have access to self-injectable epinephrine (AuviQ) 0.3mg  at all times - will refill today    - follow emergency action plan in case of allergic reaction  Follow-up 12 months or sooner if needed
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
## 2018-06-06 ENCOUNTER — Ambulatory Visit: Payer: PRIVATE HEALTH INSURANCE | Attending: Orthopedic Surgery

## 2018-06-06 ENCOUNTER — Other Ambulatory Visit: Payer: Self-pay

## 2018-06-06 DIAGNOSIS — M25611 Stiffness of right shoulder, not elsewhere classified: Secondary | ICD-10-CM | POA: Diagnosis present

## 2018-06-06 DIAGNOSIS — M25511 Pain in right shoulder: Secondary | ICD-10-CM | POA: Diagnosis not present

## 2018-06-06 DIAGNOSIS — M6281 Muscle weakness (generalized): Secondary | ICD-10-CM | POA: Diagnosis present
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
## 2018-06-10 ENCOUNTER — Encounter: Payer: Self-pay | Admitting: Physical Therapy

## 2018-06-11 ENCOUNTER — Ambulatory Visit: Payer: PRIVATE HEALTH INSURANCE | Admitting: Physical Therapy

## 2018-06-11 ENCOUNTER — Encounter: Payer: Self-pay | Admitting: Physical Therapy

## 2018-06-11 DIAGNOSIS — M25511 Pain in right shoulder: Secondary | ICD-10-CM | POA: Diagnosis not present
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
## 2018-06-12 ENCOUNTER — Encounter: Payer: Self-pay | Admitting: Physical Therapy

## 2018-06-13 ENCOUNTER — Ambulatory Visit: Payer: PRIVATE HEALTH INSURANCE | Admitting: Physical Therapy

## 2018-06-13 ENCOUNTER — Encounter: Payer: Self-pay | Admitting: Physical Therapy

## 2018-06-13 DIAGNOSIS — M6281 Muscle weakness (generalized): Secondary | ICD-10-CM

## 2018-06-13 DIAGNOSIS — M25611 Stiffness of right shoulder, not elsewhere classified: Secondary | ICD-10-CM

## 2018-06-13 DIAGNOSIS — M25511 Pain in right shoulder: Secondary | ICD-10-CM
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
## 2018-06-17 ENCOUNTER — Encounter: Payer: Self-pay | Admitting: Physical Therapy

## 2018-06-19 ENCOUNTER — Encounter: Payer: Self-pay | Admitting: Physical Therapy

## 2018-06-19 ENCOUNTER — Ambulatory Visit: Payer: PRIVATE HEALTH INSURANCE | Admitting: Physical Therapy

## 2018-06-19 DIAGNOSIS — M25511 Pain in right shoulder: Secondary | ICD-10-CM | POA: Diagnosis not present

## 2018-06-19 DIAGNOSIS — M6281 Muscle weakness (generalized): Secondary | ICD-10-CM

## 2018-06-19 DIAGNOSIS — M25611 Stiffness of right shoulder, not elsewhere classified: Secondary | ICD-10-CM

## 2018-06-19 NOTE — Patient Instructions (Signed)
Access Code: [email or web address]  URL: [email or web address]  Date: 06/19/2018  Prepared by: Blanche East   Exercises  Supine Shoulder Flexion Extension AAROM with Dowel - 15 reps - 2 sets - 2x daily - 7x weekly  Supine Shoulder Abduction AAROM with Dowel - 15 reps - 2 sets - 2x daily - 7x weekly  Supine Shoulder External Rotation with Dowel - 15 reps - 2 sets - 2x daily - 7x weekly  Prone Shoulder Row - 10 reps - 2 sets - 2x daily - 7x weekly  Isometric Shoulder Abduction at Wall - 10 reps - 2 sets - 3 hold - 2x daily - 7x weekly  Isometric Shoulder External Rotation at Wall - 10 reps - 2 sets - 3 hold - 2x daily - 7x weekly  Standing Isometric Shoulder Internal Rotation at Doorway - 10 reps - 2 sets - 3 hold - 2x daily - 7x weekly
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
## 2018-06-24 ENCOUNTER — Ambulatory Visit: Payer: PRIVATE HEALTH INSURANCE | Admitting: Physical Therapy

## 2018-06-24 ENCOUNTER — Encounter: Payer: Self-pay | Admitting: Physical Therapy

## 2018-06-24 DIAGNOSIS — M25611 Stiffness of right shoulder, not elsewhere classified: Secondary | ICD-10-CM

## 2018-06-24 DIAGNOSIS — M25511 Pain in right shoulder: Secondary | ICD-10-CM | POA: Diagnosis not present

## 2018-06-24 DIAGNOSIS — M6281 Muscle weakness (generalized): Secondary | ICD-10-CM
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
## 2018-06-26 ENCOUNTER — Encounter: Payer: Self-pay | Admitting: Physical Therapy

## 2018-06-26 ENCOUNTER — Ambulatory Visit: Payer: PRIVATE HEALTH INSURANCE | Admitting: Physical Therapy

## 2018-06-26 DIAGNOSIS — M6281 Muscle weakness (generalized): Secondary | ICD-10-CM

## 2018-06-26 DIAGNOSIS — M25611 Stiffness of right shoulder, not elsewhere classified: Secondary | ICD-10-CM

## 2018-06-26 DIAGNOSIS — M25511 Pain in right shoulder: Secondary | ICD-10-CM
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
## 2018-06-26 NOTE — Patient Instructions (Signed)
Provided handout for pulley in flexion, scaption and abduction with cues to avoid painful ROM, 2 min each, 2x a day;

## 2018-06-28 ENCOUNTER — Encounter: Payer: Self-pay | Admitting: Family Medicine

## 2018-06-28 ENCOUNTER — Ambulatory Visit (INDEPENDENT_AMBULATORY_CARE_PROVIDER_SITE_OTHER): Payer: PRIVATE HEALTH INSURANCE | Admitting: Family Medicine

## 2018-06-28 VITALS — BP 120/82 | HR 73 | Wt 159.2 lb

## 2018-06-28 DIAGNOSIS — J4520 Mild intermittent asthma, uncomplicated: Secondary | ICD-10-CM | POA: Diagnosis not present

## 2018-06-28 DIAGNOSIS — H6121 Impacted cerumen, right ear: Secondary | ICD-10-CM

## 2018-06-28 DIAGNOSIS — R7303 Prediabetes: Secondary | ICD-10-CM

## 2018-06-28 DIAGNOSIS — I10 Essential (primary) hypertension: Secondary | ICD-10-CM

## 2018-06-28 HISTORY — DX: Mild intermittent asthma, uncomplicated: J45.20

## 2018-06-28 LAB — POCT GLYCOSYLATED HEMOGLOBIN (HGB A1C): HEMOGLOBIN A1C: 6 % — AB (ref 4.0–5.6)

## 2018-06-28 MED ORDER — TRIAMTERENE-HCTZ 37.5-25 MG PO TABS: 1.0000 | ORAL_TABLET | Freq: Every day | ORAL | 1 refills | Status: DC

## 2018-06-28 NOTE — Patient Instructions (Signed)
Your hemoglobin A1c is 6.0% and in the prediabetes range still.

## 2018-06-28 NOTE — Progress Notes (Signed)
   Subjective:    Patient ID: Brittany Abbott, female    DOB: Jan 31, 1961, 57 y.o.   MRN: [id number]  HPI Chief Complaint  Patient presents with  . follow-up    follow-up on pre DM and HTN. BS- running 104-110. denied flu shot   She is here to follow up on prediabetes and HTN.  She also has a new complaint of right ear feeling clogged for several months.   Checks her BS at home and readings have been in the 100 to 110 range.   HTN- checks BP at home and readings are 120s/70s  Reports good daily compliance with triamterene- HCTZ.   Recent right rotator cuff repair and doing PT for this.  Taking Black Cohosh for hot flashes and doing well.   Asthma- mild intermittent  Triggered by allergies. Has not used her albuterol in a month.   Intermittent fasting and walking for exercise. She has lost a few pounds and is happy about this.   Denies fever, chills, dizziness, chest pain, palpitations, shortness of breath, abdominal pain, N/V/D, urinary symptoms, LE edema.   Reviewed allergies, medications, past medical, surgical, family, and social history.     Review of Systems Pertinent positives and negatives in the history of present illness.     Objective:   Physical Exam BP 120/82   Pulse 73   Wt 159 lb 3.2 oz (72.2 kg)   BMI 23.51 kg/m   Alert and in no distress. Right canal with cerumen impaction and unable to visualize TM on initial inspection.  Tympanic membranes and canals are normal post right ear lavage. Pharyngeal area is normal. Neck is supple without adenopathy or thyromegaly. Cardiac exam shows a regular sinus rhythm without murmurs or gallops. Lungs are clear to auscultation.      Assessment & Plan:  Pre-diabetes - Plan: HgB A1c  Essential hypertension - Plan: triamterene-hydrochlorothiazide (MAXZIDE-25) 37.5-25 MG tablet  Hearing loss of right ear due to cerumen impaction  Mild intermittent asthma without complication  She is doing well.  Blood pressure and  blood sugars are well controlled.  Good medication compliance and no side effects.  She will continue on her medications for now continue monitoring blood pressure and blood sugars at home. Ear lavage performed by CMA due to right ear cerumen impaction.  Post lavage TM normal and she reports improved symptoms.  She tolerated this well. She will return in 6 months for fasting CPE and follow-up on chronic health conditions.

## 2018-07-01 ENCOUNTER — Ambulatory Visit: Payer: PRIVATE HEALTH INSURANCE | Admitting: Physical Therapy

## 2018-07-01 ENCOUNTER — Encounter: Payer: Self-pay | Admitting: Physical Therapy

## 2018-07-01 DIAGNOSIS — M6281 Muscle weakness (generalized): Secondary | ICD-10-CM

## 2018-07-01 DIAGNOSIS — M25511 Pain in right shoulder: Secondary | ICD-10-CM

## 2018-07-01 DIAGNOSIS — M25611 Stiffness of right shoulder, not elsewhere classified: Secondary | ICD-10-CM
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
## 2018-07-03 ENCOUNTER — Ambulatory Visit: Payer: PRIVATE HEALTH INSURANCE | Admitting: Physical Therapy

## 2018-07-03 ENCOUNTER — Encounter: Payer: Self-pay | Admitting: Physical Therapy

## 2018-07-03 DIAGNOSIS — M6281 Muscle weakness (generalized): Secondary | ICD-10-CM

## 2018-07-03 DIAGNOSIS — M25511 Pain in right shoulder: Secondary | ICD-10-CM

## 2018-07-03 DIAGNOSIS — M25611 Stiffness of right shoulder, not elsewhere classified: Secondary | ICD-10-CM
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
## 2018-07-11 ENCOUNTER — Ambulatory Visit: Payer: 59 | Attending: Orthopedic Surgery | Admitting: Physical Therapy

## 2018-07-11 ENCOUNTER — Encounter: Payer: Self-pay | Admitting: Physical Therapy

## 2018-07-11 DIAGNOSIS — M542 Cervicalgia: Secondary | ICD-10-CM | POA: Diagnosis present

## 2018-07-11 DIAGNOSIS — M6281 Muscle weakness (generalized): Secondary | ICD-10-CM

## 2018-07-11 DIAGNOSIS — M25511 Pain in right shoulder: Secondary | ICD-10-CM | POA: Insufficient documentation

## 2018-07-11 DIAGNOSIS — M25611 Stiffness of right shoulder, not elsewhere classified: Secondary | ICD-10-CM | POA: Diagnosis not present

## 2018-07-11 DIAGNOSIS — R293 Abnormal posture: Secondary | ICD-10-CM | POA: Insufficient documentation
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
## 2018-07-15 ENCOUNTER — Encounter: Payer: Self-pay | Admitting: Physical Therapy

## 2018-07-15 ENCOUNTER — Ambulatory Visit: Payer: 59 | Admitting: Physical Therapy

## 2018-07-15 DIAGNOSIS — M25611 Stiffness of right shoulder, not elsewhere classified: Secondary | ICD-10-CM | POA: Diagnosis not present

## 2018-07-15 DIAGNOSIS — M25511 Pain in right shoulder: Secondary | ICD-10-CM

## 2018-07-15 DIAGNOSIS — M6281 Muscle weakness (generalized): Secondary | ICD-10-CM
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
## 2018-07-19 ENCOUNTER — Ambulatory Visit: Payer: 59

## 2018-07-19 DIAGNOSIS — M6281 Muscle weakness (generalized): Secondary | ICD-10-CM

## 2018-07-19 DIAGNOSIS — M25611 Stiffness of right shoulder, not elsewhere classified: Secondary | ICD-10-CM

## 2018-07-19 DIAGNOSIS — M25511 Pain in right shoulder: Secondary | ICD-10-CM

## 2018-07-19 DIAGNOSIS — R293 Abnormal posture: Secondary | ICD-10-CM

## 2018-07-19 DIAGNOSIS — M542 Cervicalgia: Secondary | ICD-10-CM
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
## 2018-07-22 ENCOUNTER — Encounter: Payer: Self-pay | Admitting: Physical Therapy

## 2018-07-22 ENCOUNTER — Ambulatory Visit: Payer: 59 | Admitting: Physical Therapy

## 2018-07-22 DIAGNOSIS — M25611 Stiffness of right shoulder, not elsewhere classified: Secondary | ICD-10-CM | POA: Diagnosis not present

## 2018-07-22 DIAGNOSIS — M6281 Muscle weakness (generalized): Secondary | ICD-10-CM

## 2018-07-22 DIAGNOSIS — M25511 Pain in right shoulder: Secondary | ICD-10-CM

## 2018-07-22 LAB — HM MAMMOGRAPHY
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
## 2018-07-26 ENCOUNTER — Ambulatory Visit: Payer: 59

## 2018-07-26 DIAGNOSIS — M25611 Stiffness of right shoulder, not elsewhere classified: Secondary | ICD-10-CM | POA: Diagnosis not present

## 2018-07-26 DIAGNOSIS — M25511 Pain in right shoulder: Secondary | ICD-10-CM

## 2018-07-26 DIAGNOSIS — R293 Abnormal posture: Secondary | ICD-10-CM

## 2018-07-26 DIAGNOSIS — M542 Cervicalgia: Secondary | ICD-10-CM

## 2018-07-26 DIAGNOSIS — M6281 Muscle weakness (generalized): Secondary | ICD-10-CM
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
## 2018-07-29 ENCOUNTER — Ambulatory Visit: Payer: 59 | Admitting: Physical Therapy

## 2018-07-30 ENCOUNTER — Encounter: Payer: Self-pay | Admitting: Physical Therapy

## 2018-07-30 ENCOUNTER — Ambulatory Visit: Payer: 59 | Admitting: Physical Therapy

## 2018-07-30 DIAGNOSIS — M6281 Muscle weakness (generalized): Secondary | ICD-10-CM

## 2018-07-30 DIAGNOSIS — M25611 Stiffness of right shoulder, not elsewhere classified: Secondary | ICD-10-CM

## 2018-07-30 DIAGNOSIS — M25511 Pain in right shoulder: Secondary | ICD-10-CM
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
## 2018-08-01 ENCOUNTER — Other Ambulatory Visit: Payer: Self-pay

## 2018-08-01 MED ORDER — BUDESONIDE-FORMOTEROL FUMARATE 80-4.5 MCG/ACT IN AERO: 2.0000 | INHALATION_SPRAY | Freq: Two times a day (BID) | RESPIRATORY_TRACT | 5 refills | Status: DC

## 2018-08-02 ENCOUNTER — Ambulatory Visit: Payer: 59

## 2018-08-13 ENCOUNTER — Encounter: Payer: Self-pay | Admitting: Physical Therapy

## 2018-08-13 ENCOUNTER — Ambulatory Visit: Payer: 59 | Attending: Orthopedic Surgery | Admitting: Physical Therapy

## 2018-08-13 DIAGNOSIS — M25511 Pain in right shoulder: Secondary | ICD-10-CM

## 2018-08-13 DIAGNOSIS — M6281 Muscle weakness (generalized): Secondary | ICD-10-CM | POA: Insufficient documentation

## 2018-08-13 DIAGNOSIS — M25611 Stiffness of right shoulder, not elsewhere classified: Secondary | ICD-10-CM
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
## 2018-08-13 NOTE — Patient Instructions (Signed)
Access Code: [email or web address]  URL: [email or web address]  Date: 08/13/2018  Prepared by: Blanche East   Exercises  Isometric Punch at Wall - 15 reps - 2 sets - 2x daily - 7x weekly  Scapular Retraction with Resistance - 15 reps - 2 sets - 2x daily - 7x weekly

## 2018-08-20 ENCOUNTER — Ambulatory Visit: Payer: 59 | Admitting: Physical Therapy

## 2018-08-20 ENCOUNTER — Encounter: Payer: Self-pay | Admitting: Physical Therapy

## 2018-08-20 DIAGNOSIS — M6281 Muscle weakness (generalized): Secondary | ICD-10-CM

## 2018-08-20 DIAGNOSIS — M25511 Pain in right shoulder: Secondary | ICD-10-CM

## 2018-08-20 DIAGNOSIS — M25611 Stiffness of right shoulder, not elsewhere classified: Secondary | ICD-10-CM
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
## 2018-08-20 NOTE — Patient Instructions (Signed)
Access Code: [email or web address]  URL: [email or web address]  Date: 08/20/2018  Prepared by: Blanche East   Exercises  Seated Shoulder Horizontal Abduction with Resistance - 15 reps - 3 sets - 2x daily - 7x weekly  Shoulder Internal Rotation with Resistance - 15 reps - 3 sets - 2x daily - 7x weekly  Shoulder External Rotation with Anchored Resistance - 15 reps - 3 sets - 2x daily - 7x weekly

## 2018-08-27 ENCOUNTER — Ambulatory Visit: Payer: 59 | Admitting: Physical Therapy

## 2018-08-27 ENCOUNTER — Encounter: Payer: Self-pay | Admitting: Physical Therapy

## 2018-08-27 DIAGNOSIS — M25511 Pain in right shoulder: Secondary | ICD-10-CM

## 2018-08-27 DIAGNOSIS — M25611 Stiffness of right shoulder, not elsewhere classified: Secondary | ICD-10-CM

## 2018-08-27 DIAGNOSIS — M6281 Muscle weakness (generalized): Secondary | ICD-10-CM
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
## 2018-08-27 NOTE — Patient Instructions (Signed)
Warm up: ball crawls up and down wall x2 min Warm up: ball circles on the wall x2 min   Internal Rotation towel stretch: 10 reps, hold each one for 10 sec; keep body well aligned! Neck side bending stretch with hand for overpressure: hold for 20 sec and 2 reps each side  Laying on back, arms straight up holding 2# weights, come straight down out to sides 2 sets x10 reps Laying on back, arms overhead with 2# weights, bend elbows and bring weights down to head, extend elbows 2 sets x10 reps  Laying on stomach, row with 2# weights until arm at level of torso 2x10 reps Laying on stomach, shoulder extension with 2# weights 2x10 reps  Standing: -Low rows with green tband at door 2x10 reps -Internal rotation with red tband at door 2x10 reps -External rotation with red tband at door 2x10 reps

## 2018-08-30 ENCOUNTER — Ambulatory Visit: Payer: 59

## 2018-08-30 DIAGNOSIS — M25611 Stiffness of right shoulder, not elsewhere classified: Secondary | ICD-10-CM | POA: Diagnosis not present

## 2018-08-30 DIAGNOSIS — M25511 Pain in right shoulder: Secondary | ICD-10-CM
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
## 2018-09-02 ENCOUNTER — Ambulatory Visit: Payer: 59

## 2018-09-02 DIAGNOSIS — M25611 Stiffness of right shoulder, not elsewhere classified: Secondary | ICD-10-CM

## 2018-09-02 DIAGNOSIS — M25511 Pain in right shoulder: Secondary | ICD-10-CM
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
## 2018-09-03 ENCOUNTER — Encounter: Payer: 59 | Admitting: Physical Therapy

## 2018-09-05 ENCOUNTER — Encounter: Payer: 59 | Admitting: Physical Therapy

## 2018-09-05 ENCOUNTER — Ambulatory Visit: Payer: 59 | Admitting: Physical Therapy

## 2018-09-10 ENCOUNTER — Encounter: Payer: Self-pay | Admitting: Physical Therapy

## 2018-09-10 ENCOUNTER — Ambulatory Visit: Payer: 59 | Attending: Orthopedic Surgery | Admitting: Physical Therapy

## 2018-09-10 DIAGNOSIS — M6281 Muscle weakness (generalized): Secondary | ICD-10-CM

## 2018-09-10 DIAGNOSIS — M25511 Pain in right shoulder: Secondary | ICD-10-CM

## 2018-09-10 DIAGNOSIS — M25611 Stiffness of right shoulder, not elsewhere classified: Secondary | ICD-10-CM | POA: Diagnosis not present
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
## 2018-09-13 ENCOUNTER — Ambulatory Visit: Payer: 59

## 2018-09-13 DIAGNOSIS — M6281 Muscle weakness (generalized): Secondary | ICD-10-CM

## 2018-09-13 DIAGNOSIS — M25611 Stiffness of right shoulder, not elsewhere classified: Secondary | ICD-10-CM | POA: Diagnosis not present

## 2018-09-13 DIAGNOSIS — M25511 Pain in right shoulder: Secondary | ICD-10-CM
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
## 2018-09-17 ENCOUNTER — Encounter: Payer: Self-pay | Admitting: Physical Therapy

## 2018-09-17 ENCOUNTER — Ambulatory Visit: Payer: 59 | Admitting: Physical Therapy

## 2018-09-17 DIAGNOSIS — M6281 Muscle weakness (generalized): Secondary | ICD-10-CM

## 2018-09-17 DIAGNOSIS — M25511 Pain in right shoulder: Secondary | ICD-10-CM

## 2018-09-17 DIAGNOSIS — M25611 Stiffness of right shoulder, not elsewhere classified: Secondary | ICD-10-CM
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
## 2018-09-19 ENCOUNTER — Encounter: Payer: Self-pay | Admitting: Physical Therapy

## 2018-09-19 ENCOUNTER — Ambulatory Visit: Payer: 59 | Admitting: Physical Therapy

## 2018-09-19 DIAGNOSIS — M6281 Muscle weakness (generalized): Secondary | ICD-10-CM

## 2018-09-19 DIAGNOSIS — M25611 Stiffness of right shoulder, not elsewhere classified: Secondary | ICD-10-CM | POA: Diagnosis not present

## 2018-09-19 DIAGNOSIS — M25511 Pain in right shoulder: Secondary | ICD-10-CM
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
## 2018-09-24 ENCOUNTER — Encounter: Payer: Self-pay | Admitting: Physical Therapy

## 2018-09-24 ENCOUNTER — Ambulatory Visit: Payer: 59 | Admitting: Physical Therapy

## 2018-09-24 DIAGNOSIS — M25511 Pain in right shoulder: Secondary | ICD-10-CM

## 2018-09-24 DIAGNOSIS — M25611 Stiffness of right shoulder, not elsewhere classified: Secondary | ICD-10-CM | POA: Diagnosis not present

## 2018-09-24 DIAGNOSIS — M6281 Muscle weakness (generalized): Secondary | ICD-10-CM
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
## 2018-09-27 ENCOUNTER — Ambulatory Visit: Payer: 59

## 2018-09-27 DIAGNOSIS — M25511 Pain in right shoulder: Secondary | ICD-10-CM

## 2018-09-27 DIAGNOSIS — M25611 Stiffness of right shoulder, not elsewhere classified: Secondary | ICD-10-CM | POA: Diagnosis not present
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
## 2018-10-02 ENCOUNTER — Ambulatory Visit: Payer: 59 | Admitting: Physical Therapy

## 2018-10-08 ENCOUNTER — Ambulatory Visit: Payer: No Typology Code available for payment source | Attending: Orthopedic Surgery

## 2018-10-08 DIAGNOSIS — M25611 Stiffness of right shoulder, not elsewhere classified: Secondary | ICD-10-CM | POA: Insufficient documentation

## 2018-10-08 DIAGNOSIS — M25511 Pain in right shoulder: Secondary | ICD-10-CM | POA: Diagnosis present

## 2018-10-08 DIAGNOSIS — M6281 Muscle weakness (generalized): Secondary | ICD-10-CM | POA: Insufficient documentation
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
## 2018-10-10 ENCOUNTER — Ambulatory Visit: Payer: No Typology Code available for payment source

## 2018-10-10 DIAGNOSIS — M25511 Pain in right shoulder: Secondary | ICD-10-CM

## 2018-10-10 DIAGNOSIS — M6281 Muscle weakness (generalized): Secondary | ICD-10-CM

## 2018-10-10 DIAGNOSIS — M25611 Stiffness of right shoulder, not elsewhere classified: Secondary | ICD-10-CM
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
## 2018-10-15 ENCOUNTER — Ambulatory Visit: Payer: No Typology Code available for payment source

## 2018-10-15 VITALS — BP 148/88 | HR 94 | Temp 98.2°F

## 2018-10-15 DIAGNOSIS — M25511 Pain in right shoulder: Secondary | ICD-10-CM

## 2018-10-15 DIAGNOSIS — M6281 Muscle weakness (generalized): Secondary | ICD-10-CM

## 2018-10-15 DIAGNOSIS — M25611 Stiffness of right shoulder, not elsewhere classified: Secondary | ICD-10-CM | POA: Diagnosis not present
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
## 2018-10-17 ENCOUNTER — Ambulatory Visit: Payer: No Typology Code available for payment source

## 2018-10-22 ENCOUNTER — Ambulatory Visit: Payer: No Typology Code available for payment source | Admitting: Physical Therapy

## 2018-10-24 ENCOUNTER — Ambulatory Visit: Payer: No Typology Code available for payment source

## 2018-10-25 ENCOUNTER — Ambulatory Visit: Payer: PRIVATE HEALTH INSURANCE | Admitting: Allergy

## 2018-10-25 ENCOUNTER — Ambulatory Visit (INDEPENDENT_AMBULATORY_CARE_PROVIDER_SITE_OTHER): Payer: PRIVATE HEALTH INSURANCE | Admitting: Allergy

## 2018-10-25 ENCOUNTER — Encounter: Payer: Self-pay | Admitting: Allergy

## 2018-10-25 VITALS — BP 102/64 | HR 102 | Resp 16 | Ht 69.0 in | Wt 164.4 lb

## 2018-10-25 DIAGNOSIS — J4530 Mild persistent asthma, uncomplicated: Secondary | ICD-10-CM

## 2018-10-25 DIAGNOSIS — J3089 Other allergic rhinitis: Secondary | ICD-10-CM | POA: Diagnosis not present

## 2018-10-25 DIAGNOSIS — Z91018 Allergy to other foods: Secondary | ICD-10-CM | POA: Diagnosis not present

## 2018-10-25 MED ORDER — FLUTICASONE-SALMETEROL 500-50 MCG/DOSE IN AEPB
1.0000 | INHALATION_SPRAY | Freq: Two times a day (BID) | RESPIRATORY_TRACT | 4 refills | Status: DC
Start: 2018-10-25 — End: 2019-04-15

## 2018-10-25 NOTE — Patient Instructions (Addendum)
Allergic rhinitis     - post-nasal drainage significant component of cough     - continue avoidance measures for grasses, dust mites and cockroach.     -  Continue use of  Astelin (nasal antihistamine) 2 sprays each nostril 1-2 times a day for control of nasal drainage    - continue zyrtec 10mg  daily as needed for allergy symptom control  Cough with history of asthma    - cough improved with management of post-nasal drainage as above    - stop symbicort.     - start Wixela 513mcg 1 puffs twice a day.   Have pharmacist demonstrate proper use.    - if symptoms persist then will add in Singulair     - have access to albuterol inhaler 2 puffs every 4-6 hours as needed for cough/wheeze/shortness of breath/chest tightness.  May use 15-20 minutes prior to activity.   Monitor frequency of use.    Asthma control goals:   Full participation in all desired activities (may need albuterol before activity)  Albuterol use two time or less a week on average (not counting use with activity)  Cough interfering with sleep two time or less a month  Oral steroids no more than once a year  No hospitalizations  Food Allergy    - continue avoidance of shellfish (crustaceans- shrimp, lobster, crab)    - have access to self-injectable epinephrine Wynona Luna) 0.3mg  at all times     - follow emergency action plan in case of allergic reaction  Follow-up 4-6 months or sooner if needed

## 2018-10-25 NOTE — Progress Notes (Signed)
Follow-up Note  RE: Brittany Abbott MRN: [id number] DOB: 18-Dec-1960 Date of Office Visit: 10/25/2018   History of present illness: Brittany Abbott is a 57 y.o. female presenting today for follow-up of allergic rhinitis, asthma and food allergy.  She was last seen in the office on June 05, 2018 by myself.  She states she was doing well up until about a month ago when she started developing cough and wheeze.  She states that she started her Symbicort and has needed to use her rescue inhaler.  She states she is still somewhat symptomatic even after starting the Symbicort.  She normally will only use her Symbicort during asthma flares.  She denies having any nighttime awakenings.  She has not required urgent care or ED visits or any oral steroid needs.  She states that the Symbicort is expensive for her as she is mostly self-pay but she does plan to have a group insurance to start next year.  She is requesting a generic alternative if available.   She denies any significant issues with her allergic rhinitis and she does continue on Astelin and Zyrtec. She does continue to avoid shellfish and does not have any accidental ingestions or need to use her epinephrine device.  Review of systems: Review of Systems  Constitutional: Negative for chills, fever and malaise/fatigue.  HENT: Negative for congestion, ear discharge, nosebleeds and sore throat.   Eyes: Negative for pain, discharge and redness.  Respiratory: Positive for cough and wheezing.   Cardiovascular: Negative for chest pain.  Gastrointestinal: Negative for abdominal pain, constipation, diarrhea, nausea and vomiting.  Musculoskeletal: Negative for joint pain.  Skin: Negative for itching and rash.  Neurological: Negative for headaches.    All other systems negative unless noted above in HPI  Past medical/social/surgical/family history have been reviewed and are unchanged unless specifically indicated below.  No changes  Medication  List: Allergies as of 10/25/2018      Reactions   Shellfish Allergy Anaphylaxis      Medication List       Accurate as of October 25, 2018  4:23 PM. Always use your most recent med list.        albuterol 108 (90 Base) MCG/ACT inhaler Commonly known as:  VENTOLIN HFA Inhale 2 puffs into the lungs every 4 (four) hours as needed for wheezing or shortness of breath.   azelastine 0.1 % nasal spray Commonly known as:  ASTELIN Place 2 sprays into both nostrils 2 (two) times daily. Use in each nostril as directed   BLACK COHOSH PO Take by mouth.   budesonide-formoterol 80-4.5 MCG/ACT inhaler Commonly known as:  SYMBICORT Inhale 2 puffs into the lungs 2 (two) times daily.   cetirizine 10 MG tablet Commonly known as:  ZYRTEC Take 10 mg by mouth daily.   Fluticasone-Salmeterol 500-50 MCG/DOSE Aepb Commonly known as:  WIXELA INHUB Inhale 1 puff into the lungs 2 (two) times daily.   triamterene-hydrochlorothiazide 37.5-25 MG tablet Commonly known as:  MAXZIDE-25 Take 1 tablet by mouth daily.   TURMERIC PO Take by mouth.       Known medication allergies: Allergies  Allergen Reactions  . Shellfish Allergy Anaphylaxis     Physical examination: Blood pressure 102/64, pulse (!) 102, resp. rate 16, height 5\' 9"  (1.753 m), weight 164 lb 6.4 oz (74.6 kg), SpO2 96 %.  General: Alert, interactive, in no acute distress. HEENT: PERRLA, TMs pearly gray, turbinates mildly edematous without discharge, post-pharynx non erythematous. Neck: Supple without lymphadenopathy. Lungs: Clear  to auscultation without wheezing, rhonchi or rales. {no increased work of breathing. CV: Normal S1, S2 without murmurs. Abdomen: Nondistended, nontender. Skin: Warm and dry, without lesions or rashes. Extremities:  No clubbing, cyanosis or edema. Neuro:   Grossly intact.  Diagnositics/Labs:  Spirometry: FEV1: 2.0L 76%, FVC: 3.08L 92%, FEV1 is slightly reduced for age  Assessment and  plan: Patient Instructions  Allergic rhinitis     - post-nasal drainage significant component of cough     - continue avoidance measures for grasses, dust mites and cockroach.     -  Continue use of  Astelin (nasal antihistamine) 2 sprays each nostril 1-2 times a day for control of nasal drainage    - continue zyrtec 10mg  daily as needed for allergy symptom control  Cough with history of asthma    - cough improved with management of post-nasal drainage as above    - stop symbicort.     - start Wixela 567mcg 1 puffs twice a day.   Have pharmacist demonstrate proper use.    - if symptoms persist then will add in Singulair     - have access to albuterol inhaler 2 puffs every 4-6 hours as needed for cough/wheeze/shortness of breath/chest tightness.  May use 15-20 minutes prior to activity.   Monitor frequency of use.    Asthma control goals:   Full participation in all desired activities (may need albuterol before activity)  Albuterol use two time or less a week on average (not counting use with activity)  Cough interfering with sleep two time or less a month  Oral steroids no more than once a year  No hospitalizations  Food Allergy    - continue avoidance of shellfish (crustaceans- shrimp, lobster, crab)    - have access to self-injectable epinephrine Brittany Abbott) 0.3mg  at all times    - follow emergency action plan in case of allergic reaction  Follow-up 4-6 months or sooner if needed  I appreciate the opportunity to take part in Kaytelyn's care. Please do not hesitate to contact me with questions.  Sincerely,   Prudy Feeler, MD Allergy/Immunology [facility]

## 2018-10-25 NOTE — Addendum Note (Signed)
Addended by: Horris Latino on: 10/25/2018 04:41 PM   Modules accepted: Orders

## 2018-10-31 ENCOUNTER — Ambulatory Visit: Payer: No Typology Code available for payment source

## 2018-11-04 ENCOUNTER — Ambulatory Visit: Payer: No Typology Code available for payment source | Admitting: Physical Therapy

## 2018-11-07 ENCOUNTER — Ambulatory Visit: Payer: 59 | Attending: Orthopedic Surgery

## 2018-11-07 DIAGNOSIS — M6281 Muscle weakness (generalized): Secondary | ICD-10-CM | POA: Insufficient documentation

## 2018-11-07 DIAGNOSIS — M542 Cervicalgia: Secondary | ICD-10-CM | POA: Insufficient documentation

## 2018-11-07 DIAGNOSIS — R293 Abnormal posture: Secondary | ICD-10-CM | POA: Insufficient documentation

## 2018-11-07 DIAGNOSIS — M25611 Stiffness of right shoulder, not elsewhere classified: Secondary | ICD-10-CM | POA: Insufficient documentation

## 2018-11-07 DIAGNOSIS — M25512 Pain in left shoulder: Secondary | ICD-10-CM | POA: Insufficient documentation

## 2018-11-07 DIAGNOSIS — M25511 Pain in right shoulder: Secondary | ICD-10-CM | POA: Insufficient documentation

## 2018-11-12 ENCOUNTER — Encounter: Payer: Self-pay | Admitting: Physical Therapy

## 2018-11-12 ENCOUNTER — Ambulatory Visit: Payer: 59 | Admitting: Physical Therapy

## 2018-11-12 DIAGNOSIS — M25611 Stiffness of right shoulder, not elsewhere classified: Secondary | ICD-10-CM | POA: Diagnosis not present

## 2018-11-12 DIAGNOSIS — M25511 Pain in right shoulder: Secondary | ICD-10-CM | POA: Diagnosis present

## 2018-11-12 DIAGNOSIS — M25512 Pain in left shoulder: Secondary | ICD-10-CM

## 2018-11-12 DIAGNOSIS — M6281 Muscle weakness (generalized): Secondary | ICD-10-CM | POA: Diagnosis present

## 2018-11-12 DIAGNOSIS — R293 Abnormal posture: Secondary | ICD-10-CM

## 2018-11-12 DIAGNOSIS — M542 Cervicalgia: Secondary | ICD-10-CM

## 2018-11-12 NOTE — Patient Instructions (Signed)
Access Code: [email or web address]  URL: [email or web address]  Date: 11/12/2018  Prepared by: Blanche East   Exercises  Standing Single Arm Shoulder Internal Rotation in Abduction with Anchored Resistance - 10 reps - 2 sets - 1x daily - 7x weekly  Standing Shoulder External Rotation with Resistance at 45 Degrees of Abduction - 10 reps - 2 sets - 1x daily - 7x weekly  Standing Eccentric Shoulder Exercise PNF D2 Pattern - 15 reps - 2 sets - 1x daily - 7x weekly  Standing Single Arm Shoulder PNF D1 Flexion with Anchored Resistance - 15 reps - 2 sets - 1x daily - 7x weekly  Shoulder Flexion with Anterior Anchored Resistance - 12 reps - 2 sets - 1x daily - 7x weekly  Seated Overhead Shoulder External Rotation Stretch with Towel - 3 reps - 3 sets - 15 hold - 1x daily - 7x weekly  Shoulder Alphabet with Dumbbell - 2 reps - 1 sets - 1x daily - 7x weekly
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
## 2018-11-14 ENCOUNTER — Encounter: Payer: PRIVATE HEALTH INSURANCE | Admitting: Physical Therapy

## 2018-11-21 ENCOUNTER — Ambulatory Visit: Payer: 59

## 2018-11-26 ENCOUNTER — Encounter: Payer: Self-pay | Admitting: Physical Therapy

## 2018-11-26 ENCOUNTER — Ambulatory Visit: Payer: 59 | Admitting: Physical Therapy

## 2018-11-26 DIAGNOSIS — M6281 Muscle weakness (generalized): Secondary | ICD-10-CM

## 2018-11-26 DIAGNOSIS — M25611 Stiffness of right shoulder, not elsewhere classified: Secondary | ICD-10-CM | POA: Diagnosis not present

## 2018-11-26 DIAGNOSIS — M25511 Pain in right shoulder: Secondary | ICD-10-CM
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
## 2018-11-26 NOTE — Patient Instructions (Addendum)
Access Code: [email or web address]  URL: [email or web address]  Date: 11/26/2018  Prepared by: Blanche East   Exercises  Quadruped Alternating Leg Extensions - 10 reps - 2 sets - 1x daily - 7x weekly  Quadruped Hip Abduction with Resistance Loop - 10 reps - 2 sets - 1x daily - 7x weekly  Quadruped Alternating Arm Lift - 10 reps - 2 sets - 1x daily - 7x weekly  Kneeling Plank with Shoulder Row and Dumbbells - 10 reps - 2 sets - 1x daily - 7x weekly  Access Code: [email or web address]  URL: [email or web address]  Date: 11/26/2018  Prepared by: Blanche East   Exercises  Reverse Fly with Anchored Resistance - 10 reps - 2 sets - 1x daily - 7x weekly

## 2018-11-28 ENCOUNTER — Ambulatory Visit: Payer: 59

## 2018-12-30 ENCOUNTER — Other Ambulatory Visit: Payer: Self-pay | Admitting: Family Medicine

## 2018-12-30 DIAGNOSIS — I10 Essential (primary) hypertension: Secondary | ICD-10-CM

## 2018-12-30 NOTE — Telephone Encounter (Signed)
Pt has an appt in march

## 2019-01-05 ENCOUNTER — Encounter: Payer: Self-pay | Admitting: Family Medicine

## 2019-01-05 DIAGNOSIS — R7303 Prediabetes: Secondary | ICD-10-CM | POA: Insufficient documentation

## 2019-01-05 DIAGNOSIS — I10 Essential (primary) hypertension: Secondary | ICD-10-CM

## 2019-01-05 HISTORY — DX: Essential (primary) hypertension: I10
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
## 2019-01-06 ENCOUNTER — Ambulatory Visit (INDEPENDENT_AMBULATORY_CARE_PROVIDER_SITE_OTHER): Payer: PRIVATE HEALTH INSURANCE | Admitting: Family Medicine

## 2019-01-06 ENCOUNTER — Encounter: Payer: Self-pay | Admitting: Family Medicine

## 2019-01-06 VITALS — BP 122/80 | HR 82 | Ht 69.0 in | Wt 159.6 lb

## 2019-01-06 DIAGNOSIS — I10 Essential (primary) hypertension: Secondary | ICD-10-CM

## 2019-01-06 DIAGNOSIS — K5790 Diverticulosis of intestine, part unspecified, without perforation or abscess without bleeding: Secondary | ICD-10-CM | POA: Insufficient documentation

## 2019-01-06 DIAGNOSIS — Z0000 Encounter for general adult medical examination without abnormal findings: Secondary | ICD-10-CM

## 2019-01-06 DIAGNOSIS — E7800 Pure hypercholesterolemia, unspecified: Secondary | ICD-10-CM | POA: Diagnosis not present

## 2019-01-06 DIAGNOSIS — R933 Abnormal findings on diagnostic imaging of other parts of digestive tract: Secondary | ICD-10-CM | POA: Insufficient documentation

## 2019-01-06 DIAGNOSIS — D2262 Melanocytic nevi of left upper limb, including shoulder: Secondary | ICD-10-CM | POA: Insufficient documentation

## 2019-01-06 DIAGNOSIS — Z23 Encounter for immunization: Secondary | ICD-10-CM | POA: Diagnosis not present

## 2019-01-06 DIAGNOSIS — K635 Polyp of colon: Secondary | ICD-10-CM | POA: Insufficient documentation

## 2019-01-06 DIAGNOSIS — R7303 Prediabetes: Secondary | ICD-10-CM

## 2019-01-06 DIAGNOSIS — Z1159 Encounter for screening for other viral diseases: Secondary | ICD-10-CM

## 2019-01-06 DIAGNOSIS — J4520 Mild intermittent asthma, uncomplicated: Secondary | ICD-10-CM | POA: Diagnosis not present

## 2019-01-06 LAB — POCT URINALYSIS DIP (PROADVANTAGE DEVICE)
BILIRUBIN UA: NEGATIVE
GLUCOSE UA: NEGATIVE mg/dL
Ketones, POC UA: NEGATIVE mg/dL
LEUKOCYTES UA: NEGATIVE
NITRITE UA: NEGATIVE
Protein Ur, POC: NEGATIVE mg/dL
RBC UA: NEGATIVE
Specific Gravity, Urine: 1.02
UUROB: NEGATIVE
pH, UA: 6.5 (ref 5.0–8.0)

## 2019-01-06 LAB — POCT GLYCOSYLATED HEMOGLOBIN (HGB A1C): HEMOGLOBIN A1C: 6.3 % — AB (ref 4.0–5.6)

## 2019-01-06 NOTE — Patient Instructions (Addendum)
It was a pleasure seeing Brittany Abbott today.  Brittany Abbott will receive a call from [facility] to keep an eye on your blood sugar and blood pressures at home. Goal blood sugars are fasting: 80-110, 2 hours after a meal: 130-160  Goal blood pressure is less than 130/80  Continue eating a healthy low-sodium and low carb diet.  Make sure Brittany Abbott are getting at least 150 minutes of vigorous physical activity per week.  I will see Brittany Abbott back in 3 months for follow-up on your blood sugars.   Preventive Care 40-64 Years, Female Preventive care refers to lifestyle choices and visits with your health care provider that can promote health and wellness. What does preventive care include?   A yearly physical exam. This is also called an annual well check.  Dental exams once or twice a year.  Routine eye exams. Ask your health care provider how often Brittany Abbott should have your eyes checked.  Personal lifestyle choices, including: ? Daily care of your teeth and gums. ? Regular physical activity. ? Eating a healthy diet. ? Avoiding tobacco and drug use. ? Limiting alcohol use. ? Practicing safe sex. ? Taking low-dose aspirin daily starting at age 24. ? Taking vitamin and mineral supplements as recommended by your health care provider. What happens during an annual well check? The services and screenings done by your health care provider during your annual well check will depend on your age, overall health, lifestyle risk factors, and family history of disease. Counseling Your health care provider may ask Brittany Abbott questions about your:  Alcohol use.  Tobacco use.  Drug use.  Emotional well-being.  Home and relationship well-being.  Sexual activity.  Eating habits.  Work and work Statistician.  Method of birth control.  Menstrual cycle.  Pregnancy history. Screening Brittany Abbott may have the following tests or measurements:  Height, weight, and BMI.  Blood pressure.  Lipid and cholesterol levels.  These may be checked every 5 years, or more frequently if Brittany Abbott are over 78 years old.  Skin check.  Lung cancer screening. Brittany Abbott may have this screening every year starting at age 37 if Brittany Abbott have a 30-pack-year history of smoking and currently smoke or have quit within the past 15 years.  Colorectal cancer screening. All adults should have this screening starting at age 91 and continuing until age 40. Your health care provider may recommend screening at age 41. Brittany Abbott will have tests every 1-10 years, depending on your results and the type of screening test. People at increased risk should start screening at an earlier age. Screening tests may include: ? Guaiac-based fecal occult blood testing. ? Fecal immunochemical test (FIT). ? Stool DNA test. ? Virtual colonoscopy. ? Sigmoidoscopy. During this test, a flexible tube with a tiny camera (sigmoidoscope) is used to examine your rectum and lower colon. The sigmoidoscope is inserted through your anus into your rectum and lower colon. ? Colonoscopy. During this test, a long, thin, flexible tube with a tiny camera (colonoscope) is used to examine your entire colon and rectum.  Hepatitis C blood test.  Hepatitis B blood test.  Sexually transmitted disease (STD) testing.  Diabetes screening. This is done by checking your blood sugar (glucose) after Brittany Abbott have not eaten for a while (fasting). Brittany Abbott may have this done every 1-3 years.  Mammogram. This may be done every 1-2 years. Talk to your health care provider about when Brittany Abbott should start having regular mammograms. This may depend on whether Brittany Abbott have a family history  of breast cancer.  BRCA-related cancer screening. This may be done if Brittany Abbott have a family history of breast, ovarian, tubal, or peritoneal cancers.  Pelvic exam and Pap test. This may be done every 3 years starting at age 57. Starting at age 48, this may be done every 5 years if Brittany Abbott have a Pap test in combination with an HPV test.  Bone density  scan. This is done to screen for osteoporosis. Brittany Abbott may have this scan if Brittany Abbott are at high risk for osteoporosis. Discuss your test results, treatment options, and if necessary, the need for more tests with your health care provider. Vaccines Your health care provider may recommend certain vaccines, such as:  Influenza vaccine. This is recommended every year.  Tetanus, diphtheria, and acellular pertussis (Tdap, Td) vaccine. Brittany Abbott may need a Td booster every 10 years.  Varicella vaccine. Brittany Abbott may need this if Brittany Abbott have not been vaccinated.  Zoster vaccine. Brittany Abbott may need this after age 32.  Measles, mumps, and rubella (MMR) vaccine. Brittany Abbott may need at least one dose of MMR if Brittany Abbott were born in 1956 or later. Brittany Abbott may also need a second dose.  Pneumococcal 13-valent conjugate (PCV13) vaccine. Brittany Abbott may need this if Brittany Abbott have certain conditions and were not previously vaccinated.  Pneumococcal polysaccharide (PPSV23) vaccine. Brittany Abbott may need one or two doses if Brittany Abbott smoke cigarettes or if Brittany Abbott have certain conditions.  Meningococcal vaccine. Brittany Abbott may need this if Brittany Abbott have certain conditions.  Hepatitis A vaccine. Brittany Abbott may need this if Brittany Abbott have certain conditions or if Brittany Abbott travel or work in places where Brittany Abbott may be exposed to hepatitis A.  Hepatitis B vaccine. Brittany Abbott may need this if Brittany Abbott have certain conditions or if Brittany Abbott travel or work in places where Brittany Abbott may be exposed to hepatitis B.  Haemophilus influenzae type b (Hib) vaccine. Brittany Abbott may need this if Brittany Abbott have certain conditions. Talk to your health care provider about which screenings and vaccines Brittany Abbott need and how often Brittany Abbott need them. This information is not intended to replace advice given to Brittany Abbott by your health care provider. Make sure Brittany Abbott discuss any questions Brittany Abbott have with your health care provider. Document Released: 11/19/2015 Document Revised: 12/13/2017 Document Reviewed: 08/24/2015 Elsevier Interactive Patient Education  2019 [employer].

## 2019-01-07 ENCOUNTER — Ambulatory Visit: Payer: 59 | Attending: Orthopedic Surgery

## 2019-01-07 ENCOUNTER — Encounter: Payer: Self-pay | Admitting: Physical Therapy

## 2019-01-07 DIAGNOSIS — M542 Cervicalgia: Secondary | ICD-10-CM | POA: Insufficient documentation

## 2019-01-07 DIAGNOSIS — M6281 Muscle weakness (generalized): Secondary | ICD-10-CM | POA: Diagnosis present

## 2019-01-07 DIAGNOSIS — R293 Abnormal posture: Secondary | ICD-10-CM | POA: Diagnosis present

## 2019-01-07 DIAGNOSIS — M25511 Pain in right shoulder: Secondary | ICD-10-CM | POA: Insufficient documentation

## 2019-01-07 DIAGNOSIS — M25611 Stiffness of right shoulder, not elsewhere classified: Secondary | ICD-10-CM | POA: Insufficient documentation

## 2019-01-07 DIAGNOSIS — M25512 Pain in left shoulder: Secondary | ICD-10-CM | POA: Insufficient documentation

## 2019-01-07 LAB — HEPATITIS C ANTIBODY: Hep C Virus Ab: <0.1 s/co ratio (ref 0.0–0.9)

## 2019-01-07 LAB — CBC WITH DIFFERENTIAL/PLATELET
Basophils Absolute: 0 10*3/uL (ref 0.0–0.2)
Basos: 0 %
EOS (ABSOLUTE): 0.1 10*3/uL (ref 0.0–0.4)
EOS: 1 %
HEMATOCRIT: 42.1 % (ref 34.0–46.6)
Hemoglobin: 14.5 g/dL (ref 11.1–15.9)
IMMATURE GRANULOCYTES: 0 %
Immature Grans (Abs): 0 10*3/uL (ref 0.0–0.1)
LYMPHS ABS: 1.3 10*3/uL (ref 0.7–3.1)
Lymphs: 15 %
MCH: 31.5 pg (ref 26.6–33.0)
MCHC: 34.4 g/dL (ref 31.5–35.7)
MCV: 92 fL (ref 79–97)
Monocytes Absolute: 0.4 10*3/uL (ref 0.1–0.9)
Monocytes: 5 %
NEUTROS PCT: 79 %
Neutrophils Absolute: 7.3 10*3/uL — ABNORMAL HIGH (ref 1.4–7.0)
PLATELETS: 307 10*3/uL (ref 150–450)
RBC: 4.6 x10E6/uL (ref 3.77–5.28)
RDW: 13.1 % (ref 11.7–15.4)
WBC: 9.2 10*3/uL (ref 3.4–10.8)

## 2019-01-07 LAB — COMPREHENSIVE METABOLIC PANEL
A/G RATIO: 2.1 (ref 1.2–2.2)
ALK PHOS: 84 IU/L (ref 39–117)
ALT: 15 IU/L (ref 0–32)
AST: 30 IU/L (ref 0–40)
Albumin: 5.2 g/dL — ABNORMAL HIGH (ref 3.8–4.9)
BUN / CREAT RATIO: 11 (ref 9–23)
BUN: 10 mg/dL (ref 6–24)
Bilirubin Total: 0.4 mg/dL (ref 0.0–1.2)
CO2: 25 mmol/L (ref 20–29)
Calcium: 10.5 mg/dL — ABNORMAL HIGH (ref 8.7–10.2)
Chloride: 101 mmol/L (ref 96–106)
Creatinine, Ser: 0.9 mg/dL (ref 0.57–1.00)
GFR calc non Af Amer: 72 mL/min/{1.73_m2} (ref >59)
GFR, EST AFRICAN AMERICAN: 83 mL/min/{1.73_m2} (ref >59)
GLOBULIN, TOTAL: 2.5 g/dL (ref 1.5–4.5)
Glucose: 109 mg/dL — ABNORMAL HIGH (ref 65–99)
Potassium: 4.6 mmol/L (ref 3.5–5.2)
Sodium: 142 mmol/L (ref 134–144)
Total Protein: 7.7 g/dL (ref 6.0–8.5)

## 2019-01-07 LAB — LIPID PANEL
CHOLESTEROL TOTAL: 213 mg/dL — AB (ref 100–199)
Chol/HDL Ratio: 3.2 ratio (ref 0.0–4.4)
HDL: 66 mg/dL (ref >39)
LDL CALC: 131 mg/dL — AB (ref 0–99)
TRIGLYCERIDES: 78 mg/dL (ref 0–149)
VLDL CHOLESTEROL CAL: 16 mg/dL (ref 5–40)

## 2019-01-07 LAB — TSH: TSH: 0.939 u[IU]/mL (ref 0.450–4.500)

## 2019-01-07 LAB — T4, FREE: Free T4: 1.43 ng/dL (ref 0.82–1.77)
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
## 2019-01-07 NOTE — Patient Instructions (Signed)
Access Code: [email or web address]  URL: [email or web address]  Date: 01/07/2019  Prepared by: Lieutenant Diego   Exercises  Standing Shoulder Single Arm PNF D2 Flexion with Resistance - 10 reps - 2 sets - 1x daily - 7x weekly  Shoulder Internal and External Rotation in Abduction - 10 reps - 2 sets - 1x daily - 7x weekly  Shoulder External Rotation and Scapular Retraction with Resistance - 10 reps - 2 sets - 1x daily - 5x weekly  Scapular Retraction with Resistance - 10 reps - 2 sets - 1x daily - 5x weekly  Standing Shoulder Flexion with Resistance - 10 reps - 2 sets - 1x daily - 5x weekly  Standing Shoulder Abduction with Resistance - 10 reps - 2 sets - 1x daily - 5x weekly  Seated Shoulder External Rotation in Abduction Supported with Resistance - 10 reps - 2 sets - 1x daily - 3x weekly

## 2019-01-10 ENCOUNTER — Telehealth: Payer: Self-pay | Admitting: Family Medicine

## 2019-01-10 NOTE — Telephone Encounter (Signed)
Received some of the records we requested from [facility]. Sending back for review.

## 2019-01-16 ENCOUNTER — Encounter: Payer: Self-pay | Admitting: Internal Medicine

## 2019-01-20 ENCOUNTER — Telehealth: Payer: Self-pay | Admitting: Family Medicine

## 2019-01-20 NOTE — Telephone Encounter (Signed)
Information given to pt and she will call in the morning with an update

## 2019-01-20 NOTE — Telephone Encounter (Signed)
Pt very concerned that she has corona virus.  States she has traveled with work to [employer] and [place] and now has dry cough, sore throat, headache, not sure about fever is menopausal and hot flashes a lot. She does not own a thermometer and doesn't want to go to the store to buy one and possibly infect others. She has asthma and is concerned.  No trouble breathing at this time, she has inhaler, but her chest feels funny.  She is concerned about coming here and infecting other people so would be fine going to tent to be tested.  Please call her and advise what to do

## 2019-01-20 NOTE — Telephone Encounter (Signed)
If someone could buy a thermometer for her and drop it off on her front porch so that she can keep an eye on this, that would be good. If not, that is ok too. She should quarantine herself for the next 14 days.  Does she have an albuterol inhaler for her asthma?  I would like for her to call us back in the morning and let me know how she is feeling and if she has fever.  We could place an order for her to go to one of the [place] sites for coronavirus testing if she meets the criteria in the morning.  Have her treat her symptoms by taking Tylenol or ibuprofen, Mucinex for cough and congestion.  Ideally someone would be able to get these medications, over-the-counter, at the pharmacy for her.  Have her stay well-hydrated. In the meantime, if she gets significantly worse before tomorrow i.e. severe shortness of breath, then she will have to go to the emergency department.

## 2019-01-21 ENCOUNTER — Telehealth: Payer: Self-pay | Admitting: Internal Medicine

## 2019-01-21 ENCOUNTER — Other Ambulatory Visit: Payer: Self-pay | Admitting: Medical

## 2019-01-21 ENCOUNTER — Telehealth: Payer: Self-pay

## 2019-01-21 DIAGNOSIS — R05 Cough: Secondary | ICD-10-CM

## 2019-01-21 DIAGNOSIS — R509 Fever, unspecified: Secondary | ICD-10-CM

## 2019-01-21 DIAGNOSIS — Z789 Other specified health status: Secondary | ICD-10-CM

## 2019-01-21 DIAGNOSIS — R059 Cough, unspecified: Secondary | ICD-10-CM

## 2019-01-21 DIAGNOSIS — R0602 Shortness of breath: Secondary | ICD-10-CM

## 2019-01-21 NOTE — Telephone Encounter (Signed)
Pt was advised Brittany Abbott 

## 2019-01-21 NOTE — Telephone Encounter (Signed)
Patient has been given instructions on where to go for testing. Patient was advised to remain isolated and that it will take up to 3-4 business days to get the results.

## 2019-01-21 NOTE — Telephone Encounter (Signed)
Per Dr. Redmond School, we needed more information on pt.   Symptoms started Wednesday/thursday last week. Congestion dry cough, headache, sore throat,low grade fever. She does have asthma, she has traveled from [place] and [place] and been in and out of airports and these places have the virus. Today she has fever, cough, sob and getting worse. Her fever usually runs around 97-98.   Travel- within the last 2 weeks.    pt has not tried asthma inhaler or mucinex for cough and congestion. Patient was advised to use her inhaler as its every 4 hours and try the mucinex first. Call us back after a couple hours to let us know if the inhaler helped at all. If not then we can possibly send for testing. If it has helped, advised to use inhaler again after the 4 hour mark and let us know if that helps anymore.  She is currently taking aleve for fever, and zyrtec.

## 2019-01-21 NOTE — Telephone Encounter (Signed)
Pt called this morning, she is worse than yesterday. Travel, cough, fever 99.1 (usually runs around 97), SOB. We have put order in for coronavirus for her to do drive through when it comes open

## 2019-01-21 NOTE — Telephone Encounter (Signed)
She started having difficulty with cough and low-grade fever last Wednesday.  Also associated with headache, sore throat.  She does have an underlying history of asthma however her inhalers and only minimally helped her shortness of breath.  She does have a history of travel to affected areas.

## 2019-01-21 NOTE — Telephone Encounter (Signed)
Give her instructions on where to go to get the drive up testing done

## 2019-01-21 NOTE — Telephone Encounter (Signed)
Patient has returned call stating she is not feeling any better, she actually feels worse. Inhaler improved her shortness of breath a little but she still has pain and tightness in her chest. She also still has headache and muscle aches with some cold chills. Patient has asthma and is concerned about having flare ups as well. Do we want to send patient for testing at this time due to no improvement?

## 2019-01-22 ENCOUNTER — Other Ambulatory Visit: Payer: Self-pay

## 2019-01-22 ENCOUNTER — Other Ambulatory Visit: Payer: Self-pay | Admitting: Family Medicine

## 2019-01-22 DIAGNOSIS — R05 Cough: Secondary | ICD-10-CM

## 2019-01-22 DIAGNOSIS — R0602 Shortness of breath: Secondary | ICD-10-CM

## 2019-01-22 DIAGNOSIS — Z789 Other specified health status: Secondary | ICD-10-CM

## 2019-01-22 DIAGNOSIS — R509 Fever, unspecified: Secondary | ICD-10-CM

## 2019-01-22 DIAGNOSIS — R059 Cough, unspecified: Secondary | ICD-10-CM

## 2019-01-22 NOTE — Progress Notes (Signed)
   Subjective:    Patient ID: Brittany Abbott, female    DOB: September 05, 1961, 58 y.o.   MRN: [id number]  HPI    Review of Systems     Objective:   Physical Exam        Assessment & Plan:

## 2019-01-25 ENCOUNTER — Telehealth: Payer: Self-pay | Admitting: Family Medicine

## 2019-01-25 NOTE — Telephone Encounter (Signed)
Left VM to ask how she is feeling and to let her know that her Covid-19 result is not back yet.

## 2019-01-26 ENCOUNTER — Other Ambulatory Visit: Payer: Self-pay | Admitting: Family Medicine

## 2019-01-26 DIAGNOSIS — I10 Essential (primary) hypertension: Secondary | ICD-10-CM

## 2019-01-27 ENCOUNTER — Telehealth: Payer: Self-pay | Admitting: Internal Medicine

## 2019-01-27 LAB — NOVEL CORONAVIRUS, NAA: SARS-COV-2, NAA: NOT DETECTED

## 2019-01-27 MED ORDER — TRIAMTERENE-HCTZ 37.5-25 MG PO TABS: 1.0000 | ORAL_TABLET | Freq: Every day | ORAL | 2 refills | Status: DC

## 2019-01-27 NOTE — Telephone Encounter (Signed)
Pt was notified that her results were negative

## 2019-01-27 NOTE — Telephone Encounter (Signed)
noted 

## 2019-01-27 NOTE — Telephone Encounter (Signed)
Pt states that she is 50% better, still has a sore throat, dry cough, some HA and some chest tightness.

## 2019-01-27 NOTE — Telephone Encounter (Signed)
Thanks. I am still waiting on her Covid-19 test result. Once I have this, we can determine treatment plan if negative and she is not continuing to improve.

## 2019-02-03 ENCOUNTER — Other Ambulatory Visit: Payer: Self-pay

## 2019-02-03 DIAGNOSIS — J3089 Other allergic rhinitis: Secondary | ICD-10-CM

## 2019-02-03 MED ORDER — AZELASTINE HCL 0.1 % NA SOLN: 2.0000 | Freq: Two times a day (BID) | NASAL | 0 refills | Status: DC

## 2019-02-17 NOTE — Progress Notes (Signed)
This patient contacted our office requesting a physician telemedicine video consultation regarding clinical questions and/or test results.  If new patient, they were referred by Brittany Dingwall, NP  Participants on the Zoom : myself and patient   The patient consented to phone consultation and was aware that a charge will be placed through their insurance.  I was in my office and the patient was at home   Encounter time:  Total time 22 minutes, with 29 minutes spent with patient on phone/webex    Brittany Lund, MD   _____________________________________________________________________________________________              Brittany Abbott Gastroenterology Consult Note:  History: Brittany Abbott 02/18/2019  Referring physician: Girtha Rm, NP-C  Reason for consult/chief complaint: Hx colon polyps  Subjective  HPI:  Colonoscopy for "family Hx colon cancer" done in [place] Jan 2017 complete to TI with reportedly good prep.  Two polyps removed right colon, report indicates 1cm each, but pathology report shows they were actually 6 mm and 4 mm in size.  Feels well - no abd pain, change bowel habits, chronic heartburn, dysphagia, odynophagia  ROS:  Review of Systems Seasonal allergies   Past Medical History: Past Medical History:  Diagnosis Date  . Allergic rhinitis   . Asthma   . Elevated LDL cholesterol level 07/16/2017   ASCVD 10 yr risk 3.9% as of 12/2017  . HTN (hypertension)   . Hypertension 01/05/2019  . Mild intermittent asthma 06/28/2018  . Mixed hyperlipidemia   . Prediabetes    Asthma largely allergy - induced  Past Surgical History: Past Surgical History:  Procedure Laterality Date  . BUNIONECTOMY    . CHOLECYSTECTOMY    . HAMMER TOE SURGERY    . ROTATOR CUFF REPAIR Right 11/2017  . SHOULDER ARTHROSCOPY Right      Family History: Family History  Problem Relation Age of Onset  . Stroke Mother   . Emphysema Father   . Prostate cancer Father   . Diabetes  Maternal Aunt   . Pancreatic cancer Other   . Allergic rhinitis Neg Hx   . Asthma Neg Hx   . Eczema Neg Hx   . Stomach cancer Neg Hx   . Rectal cancer Neg Hx    Patient reports: Mother's half-sister's granddaughter had colon cancer Two other second cousins with pancreatic cancer (grandchildren of same mother's half sister  Social History: Social History   Socioeconomic History  . Marital status: Divorced    Spouse name: Not on file  . Number of children: Not on file  . Years of education: Not on file  . Highest education level: Not on file  Occupational History  . Not on file  Social Needs  . Financial resource strain: Not on file  . Food insecurity:    Worry: Not on file    Inability: Not on file  . Transportation needs:    Medical: Not on file    Non-medical: Not on file  Tobacco Use  . Smoking status: Former Research scientist (life sciences)  . Smokeless tobacco: Never Used  . Tobacco comment: as a teen   Substance and Sexual Activity  . Alcohol use: Yes    Comment: 3-4 a week   . Drug use: No  . Sexual activity: Not Currently  Lifestyle  . Physical activity:    Days per week: Not on file    Minutes per session: Not on file  . Stress: Not on file  Relationships  . Social connections:  Talks on phone: Not on file    Gets together: Not on file    Attends religious service: Not on file    Active member of club or organization: Not on file    Attends meetings of clubs or organizations: Not on file    Relationship status: Not on file  Other Topics Concern  . 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    2 adult children who live in [place].     Allergies: Allergies  Allergen Reactions  . Shellfish Allergy Anaphylaxis    Outpatient Meds: Current Outpatient Medications  Medication Sig Dispense Refill  . albuterol (VENTOLIN HFA) 108 (90 Base) MCG/ACT inhaler Inhale 2 puffs into the lungs every 4 (four)  hours as needed for wheezing or shortness of breath. 1 Inhaler 4  . azelastine (ASTELIN) 0.1 % nasal spray Place 2 sprays into both nostrils 2 (two) times daily. Use in each nostril as directed 90 mL 0  . cetirizine (ZYRTEC) 10 MG tablet Take 10 mg by mouth daily.    . Fluticasone-Salmeterol (WIXELA INHUB) 500-50 MCG/DOSE AEPB Inhale 1 puff into the lungs 2 (two) times daily. 60 each 4  . triamterene-hydrochlorothiazide (MAXZIDE-25) 37.5-25 MG tablet Take 1 tablet by mouth daily. 30 tablet 2  . TURMERIC PO Take by mouth.     No current facility-administered medications for this visit.       ___________________________________________________________________ Objective   Exam:  No exam - virtual visit Labs:  CBC Latest Ref Rng & Units 01/06/2019 12/28/2017  WBC 3.4 - 10.8 x10E3/uL 9.2 5.9  Hemoglobin 11.1 - 15.9 g/dL 14.5 14.1  Hematocrit 34.0 - 46.6 % 42.1 41.6  Platelets 150 - 450 x10E3/uL 307 286   CMP Latest Ref Rng & Units 01/06/2019 12/28/2017  Glucose 65 - 99 mg/dL 109(H) 86  BUN 6 - 24 mg/dL 10 13  Creatinine 0.57 - 1.00 mg/dL 0.90 0.82  Sodium 134 - 144 mmol/L 142 142  Potassium 3.5 - 5.2 mmol/L 4.6 4.7  Chloride 96 - 106 mmol/L 101 102  CO2 20 - 29 mmol/L 25 24  Calcium 8.7 - 10.2 mg/dL 10.5(H) 10.1  Total Protein 6.0 - 8.5 g/dL 7.7 7.2  Total Bilirubin 0.0 - 1.2 mg/dL 0.4 0.4  Alkaline Phos 39 - 117 IU/L 84 73  AST 0 - 40 IU/L 30 21  ALT 0 - 32 IU/L 15 15   Hgb A1C 6.3  - was told prediabetic  TSH normal   Assessment: Encounter Diagnosis  Name Primary?  . Personal history of colonic polyps Yes    Considering all of the above and recently updated polyp surveillance guidelines, her next recall colonoscopy would be at a 7-year interval from the last.  Therefore, we will put her on our recall database for colonoscopy in January 2024.  Brittany Abbott was appreciative of the review and information.  She was also encouraged to call me as the need arises for any symptoms  requiring evaluation.   Thank you for the courtesy of this consult.  Please call me with any questions or concerns.  Brittany Abbott III  CC: Referring provider noted above

## 2019-02-18 ENCOUNTER — Encounter: Payer: Self-pay | Admitting: Gastroenterology

## 2019-02-18 ENCOUNTER — Ambulatory Visit (INDEPENDENT_AMBULATORY_CARE_PROVIDER_SITE_OTHER): Payer: 59 | Admitting: Gastroenterology

## 2019-02-18 ENCOUNTER — Other Ambulatory Visit: Payer: Self-pay

## 2019-02-18 DIAGNOSIS — Z86010 Personal history of colonic polyps: Secondary | ICD-10-CM | POA: Diagnosis not present

## 2019-04-07 NOTE — Progress Notes (Deleted)
   Subjective:    Patient ID: Brittany Abbott, female    DOB: Jul 10, 1961, 58 y.o.   MRN: [id number]  HPI No chief complaint on file.  Here for a medication management.   Prediabetes- last Hgb A1c 6.3%   HTN-   Elevated LDL-   Elevated calcium-   Asthma-   Illness and was tested for Covid-19   Colonoscopy due in 2020    Review of Systems Pertinent positives and negatives in the history of present illness.     Objective:   Physical Exam There were no vitals taken for this visit.        Assessment & Plan:  Prediabetes  Elevated LDL cholesterol level  Mild intermittent asthma without complication  Essential hypertension  Serum calcium elevated

## 2019-04-08 ENCOUNTER — Other Ambulatory Visit: Payer: Self-pay

## 2019-04-08 ENCOUNTER — Ambulatory Visit: Payer: PRIVATE HEALTH INSURANCE | Admitting: Family Medicine

## 2019-04-15 ENCOUNTER — Encounter: Payer: Self-pay | Admitting: Allergy

## 2019-04-15 ENCOUNTER — Telehealth (INDEPENDENT_AMBULATORY_CARE_PROVIDER_SITE_OTHER): Payer: 59 | Admitting: Allergy

## 2019-04-15 ENCOUNTER — Ambulatory Visit: Payer: 59 | Admitting: Allergy

## 2019-04-15 DIAGNOSIS — J3089 Other allergic rhinitis: Secondary | ICD-10-CM

## 2019-04-15 DIAGNOSIS — Z91018 Allergy to other foods: Secondary | ICD-10-CM

## 2019-04-15 DIAGNOSIS — J4530 Mild persistent asthma, uncomplicated: Secondary | ICD-10-CM

## 2019-04-15 MED ORDER — ALBUTEROL SULFATE HFA 108 (90 BASE) MCG/ACT IN AERS: 2.0000 | INHALATION_SPRAY | RESPIRATORY_TRACT | 4 refills | Status: DC | PRN

## 2019-04-15 MED ORDER — AZELASTINE HCL 0.1 % NA SOLN: 2.0000 | Freq: Two times a day (BID) | NASAL | 5 refills | Status: DC

## 2019-04-15 MED ORDER — BECLOMETHASONE DIPROP HFA 80 MCG/ACT IN AERB: INHALATION_SPRAY | RESPIRATORY_TRACT | 5 refills | Status: DC

## 2019-04-15 MED ORDER — MONTELUKAST SODIUM 10 MG PO TABS: 10.0000 mg | ORAL_TABLET | Freq: Every day | ORAL | 5 refills | Status: DC

## 2019-04-15 NOTE — Patient Instructions (Addendum)
Asthma with predominant cough    - not controlled currently    - start Qvar 32mcg 2 puffs twice a day    - resume use of Singulair 10mg  daily at bedtime    - have access to albuterol inhaler 2 puffs every 4-6 hours as needed for cough/wheeze/shortness of breath/chest tightness.  May use 15-20 minutes prior to activity.   Monitor frequency of use.    Asthma control goals:   Full participation in all desired activities (may need albuterol before activity)  Albuterol use two time or less a week on average (not counting use with activity)  Cough interfering with sleep two time or less a month  Oral steroids no more than once a year  No hospitalizations  Allergic rhinitis     - post-nasal drainage significant component of cough     - continue avoidance measures for grasses, dust mites and cockroach.     -  Continue use of  Astelin (nasal antihistamine) 2 sprays each nostril 1-2 times a day for control of nasal drainage    - continue zyrtec 10mg  daily as needed for allergy symptom control  Food Allergy    - continue avoidance of shellfish (crustaceans- shrimp, lobster, crab)    - have access to self-injectable epinephrine Wynona Luna) 0.3mg  at all times     - follow emergency action plan in case of allergic reaction  Follow-up 1-2 weeks or sooner if needed

## 2019-04-15 NOTE — Progress Notes (Signed)
RE: Brittany Abbott MRN: [id number] DOB: 06/23/1961 Date of Telemedicine Visit: 04/15/2019  Referring provider: Girtha Rm, NP-C Primary care provider: Girtha Rm, NP-C  Chief Complaint: Asthma   Telemedicine Follow Up Visit via Telephone: I connected with Brittany Abbott for a follow up on 04/15/19 by telephone and verified that I am speaking with the correct person using two identifiers.   I discussed the limitations, risks, security and privacy concerns of performing an evaluation and management service by telephone and the availability of in person appointments. I also discussed with the patient that there may be a patient responsible charge related to this service. The patient expressed understanding and agreed to proceed.  Patient is at home.  Provider is at the office.  Visit start time: 1321 Visit end time: Lewis consent/check in by: Lupita Raider Medical consent and medical assistant/nurse: Timoteo Expose   History of Present Illness: She is a 58 y.o. female, who is being followed for cough variant asthma, allergic rhinitis and food allergy. Her previous allergy office visit was on 10/25/18 with Dr. Nelva Bush.   She reports that earlier this year she was traveling a lot for her work including to places like [place].  She states she had some kind of respiratory illness around March but tested negative for Covid.  She states around April the cough got a bit worse.  The last few weeks she reports still coughing and will get into coughing bouts until she coughs up from phlegm.  She reports she has to do a lot of zoom conferences for work and she is constantly coughing thru them.  She also reports poor sleep due to cough and has been sleeping more upright at night.  She states her friend has noticed her wheezing as well.  She is using albuterol more in the last 3 weeks and does get some relief.  She is not using any maintenance inhalers at this point after last visit she states the [facility] was  still over $100 and was too expensive even with goodRx or other prescription discount programs.   She feels that her post-nasal drainage also is worse.  Initially the Astelin was controlling post-nasal drip well and she continues to use it as she states she is worried if she doesn't use it the drip will be even worse.  She does not feel that zyrtec is helping either but again doesn't want to stop as may be helping somewhat.   Still avoiding shellfish without any accidental ingestions.   Assessment and Plan: Brittany Abbott is a 58 y.o. female with:   Asthma with predominant cough    - not controlled currently    - start Qvar 75mcg 2 puffs twice a day    - resume use of Singulair 10mg  daily at bedtime    - have access to albuterol inhaler 2 puffs every 4-6 hours as needed for cough/wheeze/shortness of breath/chest tightness.  May use 15-20 minutes prior to activity.   Monitor frequency of use.    Asthma control goals:   Full participation in all desired activities (may need albuterol before activity)  Albuterol use two time or less a week on average (not counting use with activity)  Cough interfering with sleep two time or less a month  Oral steroids no more than once a year  No hospitalizations  Allergic rhinitis     - post-nasal drainage significant component of cough     - continue avoidance measures for grasses, dust mites and cockroach.     -  Continue use of  Astelin (nasal antihistamine) 2 sprays each nostril 1-2 times a day for control of nasal drainage    - continue zyrtec 10mg  daily as needed for allergy symptom control  Food Allergy    - continue avoidance of shellfish (crustaceans- shrimp, lobster, crab)    - have access to self-injectable epinephrine Wynona Luna) 0.3mg  at all times     - follow emergency action plan in case of allergic reaction   Diagnostics: None.  Medication List:  Current Outpatient Medications  Medication Sig Dispense Refill  . albuterol (VENTOLIN HFA) 108  (90 Base) MCG/ACT inhaler Inhale 2 puffs into the lungs every 4 (four) hours as needed for wheezing or shortness of breath. 1 Inhaler 4  . azelastine (ASTELIN) 0.1 % nasal spray Place 2 sprays into both nostrils 2 (two) times daily. Use in each nostril as directed 90 mL 5  . cetirizine (ZYRTEC) 10 MG tablet Take 10 mg by mouth daily.    Marland Kitchen triamterene-hydrochlorothiazide (MAXZIDE-25) 37.5-25 MG tablet Take 1 tablet by mouth daily. 30 tablet 2  . TURMERIC PO Take by mouth.    . beclomethasone (QVAR REDIHALER) 80 MCG/ACT inhaler Inhale 2 puffs into the lungs twice daily 10.6 g 5  . montelukast (SINGULAIR) 10 MG tablet Take 1 tablet (10 mg total) by mouth at bedtime. 30 tablet 5   No current facility-administered medications for this visit.    Allergies: Allergies  Allergen Reactions  . Shellfish Allergy Anaphylaxis   I reviewed her past medical history, social history, family history, and environmental history and no significant changes have been reported from previous visit on 10/25/18.   Review of Systems  Constitutional: Negative for chills and fever.  HENT: Positive for postnasal drip. Negative for congestion, sinus pain, sneezing and sore throat.   Eyes: Negative for discharge, redness and itching.  Respiratory: Positive for cough and wheezing.   Cardiovascular: Negative.   Gastrointestinal: Negative.   Musculoskeletal: Negative for myalgias.  Skin: Negative for rash.  Neurological: Positive for headaches.   Objective: Physical Exam Not obtained as encounter was done via telephone.   Previous notes and tests were reviewed.  I discussed the assessment and treatment plan with the patient. The patient was provided an opportunity to ask questions and all were answered. The patient agreed with the plan and demonstrated an understanding of the instructions.   The patient was advised to call back or seek an in-person evaluation if the symptoms worsen or if the condition fails to  improve as anticipated.  I provided 43 minutes of non-face-to-face time during this encounter.  It was my pleasure to participate in [facility] care today. Please feel free to contact me with any questions or concerns.   Sincerely,  Lourdez Mcgahan Charmian Muff, MD

## 2019-04-21 ENCOUNTER — Other Ambulatory Visit: Payer: Self-pay | Admitting: Family Medicine

## 2019-04-21 DIAGNOSIS — I10 Essential (primary) hypertension: Secondary | ICD-10-CM

## 2019-04-22 ENCOUNTER — Telehealth (INDEPENDENT_AMBULATORY_CARE_PROVIDER_SITE_OTHER): Payer: Self-pay | Admitting: Allergy

## 2019-04-22 ENCOUNTER — Encounter: Payer: Self-pay | Admitting: Allergy

## 2019-04-22 ENCOUNTER — Other Ambulatory Visit: Payer: Self-pay

## 2019-04-22 DIAGNOSIS — J4530 Mild persistent asthma, uncomplicated: Secondary | ICD-10-CM

## 2019-04-22 DIAGNOSIS — J3089 Other allergic rhinitis: Secondary | ICD-10-CM

## 2019-04-22 MED ORDER — IPRATROPIUM BROMIDE 0.06 % NA SOLN: NASAL | 5 refills | Status: DC

## 2019-04-22 NOTE — Patient Instructions (Addendum)
Asthma with predominant cough    - improved    - continue Qvar 79mcg 2 puffs twice a day    - continue Singulair 10mg  daily at bedtime    - have access to albuterol inhaler 2 puffs every 4-6 hours as needed for cough/wheeze/shortness of breath/chest tightness.  May use 15-20 minutes prior to activity.   Monitor frequency of use.    Asthma control goals:   Full participation in all desired activities (may need albuterol before activity)  Albuterol use two time or less a week on average (not counting use with activity)  Cough interfering with sleep two time or less a month  Oral steroids no more than once a year  No hospitalizations  Allergic rhinitis     - post-nasal drainage significant component of cough     - continue avoidance measures for grasses, dust mites and cockroach.     -  Continue use of  Astelin (nasal antihistamine) 2 sprays each nostril 1-2 times a day for control of nasal drainage    - for acute relief of nasal drainage use nasal Atrovent 2 sprays each nostril up to 3-4 times a day if needed    - trial Xyzal 5mg  daily to see if more effective than zyrtec  Food Allergy    - continue avoidance of shellfish (crustaceans- shrimp, lobster, crab)    - have access to self-injectable epinephrine Wynona Luna) 0.3mg  at all times     - follow emergency action plan in case of allergic reaction  Follow-up 3-4 months or sooner if needed

## 2019-04-22 NOTE — Progress Notes (Signed)
RE: Brittany Abbott MRN: [id number] DOB: 1960/12/31 Date of Telemedicine Visit: 04/22/2019  Referring provider: Girtha Rm, NP-C Primary care provider: Girtha Rm, NP-C  Chief Complaint: Asthma   Telemedicine Follow Up Visit via [place] video: I connected with Ailyne Pawley for a follow up on 04/22/19 by telephone and verified that I am speaking with the correct person using two identifiers.   I discussed the limitations, risks, security and privacy concerns of performing an evaluation and management service by telephone and the availability of in person appointments. I also discussed with the patient that there may be a patient responsible charge related to this service. The patient expressed understanding and agreed to proceed.  Patient is at home.  Provider is at the office.  Visit start time: 7425 Visit end time: Brittany Abbott consent/check in by: [facility] consent and medical assistant/nurse: Jarrett Soho K  History of Present Illness: She is a 58 y.o. female, who is being followed for follow-up of asthma and allergic rhinitis.  Also with history of food allergy.  She was seen by video visit 1 week ago and pt wanted to follow-up this week to ensure symptom improvement with change in medication regimen.   At last week's visit advised that she start Qvar and resume use of singulair to help with cough symptoms.   She states that this has improved her cough however she still have some cough still.  Believes the cough now is moreso related to PND.  She is still taking astelin 2 sprays each nostril twice a day and states it does not seem as effective as it was at the beginning.  Also taking zyrtec daily but feels it doesn't last as long as it use to.  She reports trying claritin and allegra before but not tried xyzal yet.     Assessment and Plan: [name] is a 58 y.o. female with:   Asthma with predominant cough    - improved    - continue Qvar 66mcg 2 puffs twice a day    -  continue Singulair 10mg  daily at bedtime    - have access to albuterol inhaler 2 puffs every 4-6 hours as needed for cough/wheeze/shortness of breath/chest tightness.  May use 15-20 minutes prior to activity.   Monitor frequency of use.    Asthma control goals:   Full participation in all desired activities (may need albuterol before activity)  Albuterol use two time or less a week on average (not counting use with activity)  Cough interfering with sleep two time or less a month  Oral steroids no more than once a year  No hospitalizations  Allergic rhinitis     - post-nasal drainage significant component of cough     - continue avoidance measures for grasses, dust mites and cockroach.     -  Continue use of  Astelin (nasal antihistamine) 2 sprays each nostril 1-2 times a day for control of nasal drainage    - for acute relief of nasal drainage use nasal Atrovent 2 sprays each nostril up to 3-4 times a day if needed    - trial Xyzal 5mg  daily to see if more effective than zyrtec  Food Allergy    - continue avoidance of shellfish (crustaceans- shrimp, lobster, crab)    - have access to self-injectable epinephrine Brittany Abbott) 0.3mg  at all times     - follow emergency action plan in case of allergic reaction  Follow-up 3-4 months or sooner if needed  Diagnostics: None.  Medication List:  Current Outpatient Medications  Medication Sig Dispense Refill   albuterol (VENTOLIN HFA) 108 (90 Base) MCG/ACT inhaler Inhale 2 puffs into the lungs every 4 (four) hours as needed for wheezing or shortness of breath. 1 Inhaler 4   azelastine (ASTELIN) 0.1 % nasal spray Place 2 sprays into both nostrils 2 (two) times daily. Use in each nostril as directed 90 mL 5   beclomethasone (QVAR REDIHALER) 80 MCG/ACT inhaler Inhale 2 puffs into the lungs twice daily 10.6 g 5   cetirizine (ZYRTEC) 10 MG tablet Take 10 mg by mouth daily.     ipratropium (ATROVENT) 0.06 % nasal spray Can use two sprays in each  nostril up to four times daily as needed for drainage. 15 mL 5   montelukast (SINGULAIR) 10 MG tablet Take 1 tablet (10 mg total) by mouth at bedtime. 30 tablet 5   triamterene-hydrochlorothiazide (MAXZIDE-25) 37.5-25 MG tablet TAKE 1 TABLET BY MOUTH EVERY DAY 90 tablet 0   TURMERIC PO Take by mouth.     No current facility-administered medications for this visit.    Allergies: Allergies  Allergen Reactions   Shellfish Allergy Anaphylaxis   I reviewed her past medical history, social history, family history, and environmental history and no significant changes have been reported from previous visit on 04/15/2019.  Review of Systems  Constitutional: Negative for chills and fever.  HENT: Positive for postnasal drip, rhinorrhea, sneezing and sore throat. Negative for congestion, ear discharge, ear pain, sinus pressure and sinus pain.   Eyes: Negative for discharge, redness and itching.  Respiratory: Positive for cough. Negative for chest tightness, shortness of breath and wheezing.   Cardiovascular: Negative.   Gastrointestinal: Negative.   Musculoskeletal: Negative for myalgias.  Skin: Negative for rash.  Neurological: Negative for headaches.   Objective: Physical Exam Not obtained as encounter was done via telephone.   Previous notes and tests were reviewed.  I discussed the assessment and treatment plan with the patient. The patient was provided an opportunity to ask questions and all were answered. The patient agreed with the plan and demonstrated an understanding of the instructions.   The patient was advised to call back or seek an in-person evaluation if the symptoms worsen or if the condition fails to improve as anticipated.  I provided 31 minutes of non-face-to-face time during this encounter.  It was my pleasure to participate in [facility] care today. Please feel free to contact me with any questions or concerns.   Sincerely,  Trenton Passow Charmian Muff, MD

## 2019-05-14 ENCOUNTER — Ambulatory Visit: Payer: PRIVATE HEALTH INSURANCE | Admitting: Family Medicine

## 2019-07-13 ENCOUNTER — Other Ambulatory Visit: Payer: Self-pay | Admitting: Family Medicine

## 2019-07-13 DIAGNOSIS — I10 Essential (primary) hypertension: Secondary | ICD-10-CM

## 2019-08-15 ENCOUNTER — Other Ambulatory Visit: Payer: Self-pay | Admitting: Emergency Medicine

## 2019-08-15 DIAGNOSIS — Z20822 Contact with and (suspected) exposure to covid-19: Secondary | ICD-10-CM

## 2019-08-16 LAB — NOVEL CORONAVIRUS, NAA: SARS-CoV-2, NAA: NOT DETECTED

## 2019-08-25 ENCOUNTER — Other Ambulatory Visit: Payer: Self-pay

## 2019-08-25 DIAGNOSIS — Z20822 Contact with and (suspected) exposure to covid-19: Secondary | ICD-10-CM

## 2019-08-27 LAB — NOVEL CORONAVIRUS, NAA: SARS-CoV-2, NAA: NOT DETECTED

## 2019-09-23 ENCOUNTER — Encounter: Payer: Self-pay | Admitting: Family Medicine

## 2019-09-23 ENCOUNTER — Ambulatory Visit (INDEPENDENT_AMBULATORY_CARE_PROVIDER_SITE_OTHER): Payer: Self-pay | Admitting: Family Medicine

## 2019-09-23 ENCOUNTER — Other Ambulatory Visit: Payer: Self-pay

## 2019-09-23 DIAGNOSIS — Z91018 Allergy to other foods: Secondary | ICD-10-CM

## 2019-09-23 DIAGNOSIS — J309 Allergic rhinitis, unspecified: Secondary | ICD-10-CM | POA: Insufficient documentation

## 2019-09-23 DIAGNOSIS — J3089 Other allergic rhinitis: Secondary | ICD-10-CM

## 2019-09-23 DIAGNOSIS — J0100 Acute maxillary sinusitis, unspecified: Secondary | ICD-10-CM

## 2019-09-23 DIAGNOSIS — J4530 Mild persistent asthma, uncomplicated: Secondary | ICD-10-CM

## 2019-09-23 MED ORDER — PREDNISONE 10 MG PO TABS: ORAL_TABLET | ORAL | 0 refills | Status: DC

## 2019-09-23 NOTE — Patient Instructions (Addendum)
Acute sinusitis Prednisone 10 mg tablets. Take 2 tablets twice a day for 3 days, then take 2 tablets once a day for 1 day, then take 1 tablet on the 5th day, then stop Begin saline nasal rinses as needed for nasal symptoms. Use this before any medicated nasal sprays for best result Begin Flonase nasal spray 1-2 sprays in each nostril once a day.  In the right nostril, point the applicator out toward the right ear. In the left nostril, point the applicator out toward the left ear For thick post nasal drainage, begin Mucinex (guiafenisen) [phone] mg twice a day and increase fluid intake to thin nasal secretions Stop cetirizine for the next 4-5 days in order to thin secretions If your symptoms worsen or do not improve, call the clinic and we will evaluate further for antibiotic need  Allergic rhinitis Follow the treatment plan as above Continue azelastine as needed for a runny nose or sinus headache  Asthma Begin montelukast 10 mg once a day. This may help reduce allergy symptoms also For asthma flares, begin Qvar 2 puffs twice a day with a spacer for 2 weeks or until cough and wheeze free Continue albuterol 2 puffs every 4 hours as needed for cough or wheeze  Food allergy Continue to avoid shellfish. IIn case of an allergic reaction, take Benadryl 50 mg every 4 hours, and if life-threatening symptoms occur, inject with AuviQ 0.3 mg.  Call the clinic if this treatment plan is not working well for you  Follow up in 2 months or sooner if needed

## 2019-09-23 NOTE — Progress Notes (Signed)
RE: Brittany Abbott MRN: [id number] DOB: 07/19/1961 Date of Telemedicine Visit: 09/23/2019  Referring provider: Girtha Rm, NP-C Primary care provider: Girtha Rm, NP-C  Chief Complaint: Asthma and Sinusitis (congestion, drainage, sore throat, pressure )   Telemedicine Follow Up Visit via Telephone: I connected with Brittany Abbott for a follow up on 09/23/19 by telephone and verified that I am speaking with the correct person using two identifiers.   I discussed the limitations, risks, security and privacy concerns of performing an evaluation and management service by telephone and the availability of in person appointments. I also discussed with the patient that there may be a patient responsible charge related to this service. The patient expressed understanding and agreed to proceed.  Patient is at home Provider is at the office.  Visit start time: 3:00 Visit end time: 3:18 Insurance consent/check in by: Stephen consent and medical assistant/nurse: Hedy Camara  History of Present Illness: She is a 58 y.o. female, who is being followed for asthma, allergic rhinitis, asthma, and food allergy. Her previous allergy office visit was on 12/22/2018 with Dr. Nelva Bush. At today's visit, she reports that she has been experiencing nasal congestion, nasal itching, thick post nasal drainage, sore throat, and sinus pressure under her eyes since Thursday. She denies fever, sweats, chills, and sick contacts. She denies nasal drainage. She is currently using azelastine and cetirizine daily. She is not using Flonase or nasal sinus rinses. Asthma is reported as well controlled with no shortness of breath or wheeze with activity or rest. 'She is reporting a dry cough that developed over the last few days. She is not currently using Qvar, montelukast, or albuterol at this time as she does not feel that she needs these at this time. She is out of montelukast. Her current medications are listed in the  chart.   Assessment and Plan: [name] is a 58 y.o. female with: Patient Instructions  Acute sinusitis Prednisone 10 mg tablets. Take 2 tablets twice a day for 3 days, then take 2 tablets once a day for 1 day, then take 1 tablet on the 5th day, then stop Begin saline nasal rinses as needed for nasal symptoms. Use this before any medicated nasal sprays for best result Begin Flonase nasal spray 1-2 sprays in each nostril once a day.  In the right nostril, point the applicator out toward the right ear. In the left nostril, point the applicator out toward the left ear For thick post nasal drainage, begin Mucinex (guiafenisen) [phone] mg twice a day and increase fluid intake to thin nasal secretions Stop cetirizine for the next 4-5 days in order to thin secretions If your symptoms worsen or do not improve, call the clinic and we will evaluate further for antibiotic need  Allergic rhinitis Follow the treatment plan as above Continue azelastine as needed for a runny nose or sinus headache  Asthma Begin montelukast 10 mg once a day. This may help reduce allergy symptoms also For asthma flares, begin Qvar 2 puffs twice a day with a spacer for 2 weeks or until cough and wheeze free Continue albuterol 2 puffs every 4 hours as needed for cough or wheeze  Food allergy Continue to avoid shellfish. IIn case of an allergic reaction, take Benadryl 50 mg every 4 hours, and if life-threatening symptoms occur, inject with AuviQ 0.3 mg.  Call the clinic if this treatment plan is not working well for you  Follow up in 2 months or sooner if needed  Return in about 2 months (around 11/23/2019), or if symptoms worsen or fail to improve.  Meds ordered this encounter  Medications  . predniSONE (DELTASONE) 10 MG tablet    Sig: Take 2 tablets by mouth twice a day for 3 days, then take 2 tablets once a day for 1 day, then take 1 tablet on the 5th day then stop.    Dispense:  9 tablet    Refill:  0   Lab  Orders  No laboratory test(s) ordered today    Diagnostics: None.  Medication List:  Current Outpatient Medications  Medication Sig Dispense Refill  . azelastine (ASTELIN) 0.1 % nasal spray Place 2 sprays into both nostrils 2 (two) times daily. Use in each nostril as directed 90 mL 5  . cetirizine (ZYRTEC) 10 MG tablet Take 10 mg by mouth daily.    . montelukast (SINGULAIR) 10 MG tablet Take 1 tablet (10 mg total) by mouth at bedtime. 30 tablet 5  . triamterene-hydrochlorothiazide (MAXZIDE-25) 37.5-25 MG tablet TAKE 1 TABLET BY MOUTH EVERY DAY 90 tablet 0  . TURMERIC PO Take by mouth.    Marland Kitchen albuterol (VENTOLIN HFA) 108 (90 Base) MCG/ACT inhaler Inhale 2 puffs into the lungs every 4 (four) hours as needed for wheezing or shortness of breath. (Patient not taking: Reported on 09/23/2019) 1 Inhaler 4  . beclomethasone (QVAR REDIHALER) 80 MCG/ACT inhaler Inhale 2 puffs into the lungs twice daily (Patient not taking: Reported on 09/23/2019) 10.6 g 5  . ipratropium (ATROVENT) 0.06 % nasal spray Can use two sprays in each nostril up to four times daily as needed for drainage. (Patient not taking: Reported on 09/23/2019) 15 mL 5  . predniSONE (DELTASONE) 10 MG tablet Take 2 tablets by mouth twice a day for 3 days, then take 2 tablets once a day for 1 day, then take 1 tablet on the 5th day then stop. 9 tablet 0   No current facility-administered medications for this visit.    Allergies: Allergies  Allergen Reactions  . Shellfish Allergy Anaphylaxis   I reviewed her past medical history, social history, family history, and environmental history and no significant changes have been reported from previous visit on 04/22/2019.  Objective: Physical Exam Not obtained as encounter was done via telephone.   Previous notes and tests were reviewed.  I discussed the assessment and treatment plan with the patient. The patient was provided an opportunity to ask questions and all were answered. The patient  agreed with the plan and demonstrated an understanding of the instructions.   The patient was advised to call back or seek an in-person evaluation if the symptoms worsen or if the condition fails to improve as anticipated.  I provided 18 minutes of non-face-to-face time during this encounter.  It was my pleasure to participate in [facility] care today. Please feel free to contact me with any questions or concerns.   Sincerely,  Gareth Morgan, FNP

## 2019-09-23 NOTE — Progress Notes (Signed)
Telephone Visit Patient is at home.  Provider is in the office.  Start time: 3:00 End time: 3:18 Verbal consent was given to file insurance.

## 2019-09-24 ENCOUNTER — Other Ambulatory Visit: Payer: Self-pay

## 2019-09-24 MED ORDER — PREDNISONE 10 MG PO TABS: ORAL_TABLET | ORAL | 0 refills | Status: DC
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
## 2019-10-23 ENCOUNTER — Other Ambulatory Visit: Payer: Self-pay | Admitting: Family Medicine

## 2019-10-23 DIAGNOSIS — I10 Essential (primary) hypertension: Secondary | ICD-10-CM

## 2019-11-29 ENCOUNTER — Other Ambulatory Visit: Payer: Self-pay | Admitting: Family Medicine

## 2019-11-29 DIAGNOSIS — I10 Essential (primary) hypertension: Secondary | ICD-10-CM

## 2019-11-29 MED ORDER — TRIAMTERENE-HCTZ 37.5-25 MG PO TABS: 1.0000 | ORAL_TABLET | Freq: Every day | ORAL | 0 refills | Status: DC

## 2019-12-21 ENCOUNTER — Other Ambulatory Visit: Payer: Self-pay | Admitting: Family Medicine

## 2019-12-21 DIAGNOSIS — I10 Essential (primary) hypertension: Secondary | ICD-10-CM

## 2020-01-14 ENCOUNTER — Other Ambulatory Visit: Payer: Self-pay | Admitting: Family Medicine

## 2020-01-14 DIAGNOSIS — I10 Essential (primary) hypertension: Secondary | ICD-10-CM

## 2020-01-14 NOTE — Telephone Encounter (Signed)
Pt has an appt.

## 2020-01-18 ENCOUNTER — Other Ambulatory Visit: Payer: Self-pay | Admitting: Family Medicine

## 2020-01-18 DIAGNOSIS — I10 Essential (primary) hypertension: Secondary | ICD-10-CM

## 2020-01-19 NOTE — Telephone Encounter (Signed)
Has upcoming appt °

## 2020-02-03 NOTE — Progress Notes (Signed)
Subjective:    Patient ID: Brittany Abbott, female    DOB: [birth year], 59 y.o.   MRN: [id number]  HPI Chief Complaint  Patient presents with  . fasting cpe    fasting cpe, sees obgyn, no other concerns.    She is here for a complete physical exam and to follow up on chronic health conditions.   Other providers: OB/GYN  Allergist- Dr. Nelva Bush  GI- Dr. Michail Sermon  HTN- taking triamterene- HCTZ and her BP is doing well. No concerns   Prediabetes- Hgb A1c 6.3% in 01/2019 Diet is fairly healthy. No regular exercise   Asthma- controlled and sees allergist  Hx of vitamin D def- taking 3,000 IUs daily.   She is taking multiple vitamins and supplements.   Hx of elevated serum calcium which needs follow up.   She is currently interviewing for jobs.   Immunizations: Tdap and Covid vaccines up to date   Health maintenance:  Mammogram: overdue and will call and schedule. Has to wait for 6 weeks after Covid vaccine  Colonoscopy: 2017 and due this year since she had polyps  Last Gynecological Exam: in the past 2 weeks  Last Dental Exam: one year ago  Last Eye Exam: 1 1/2 years ago   Wears seatbelt always, smoke detectors in home and functioning, does not text while driving and feels safe in home environment.   Reviewed allergies, medications, past medical, surgical, family, and social history.   Review of Systems Review of Systems Constitutional: -fever, -chills, -sweats, -unexpected weight change,-fatigue ENT: -runny nose, -ear pain, -sore throat Cardiology:  -chest pain, -palpitations, -edema Respiratory: -cough, -shortness of breath, -wheezing Gastroenterology: -abdominal pain, -nausea, -vomiting, -diarrhea, -constipation  Hematology: -bleeding or bruising problems Musculoskeletal: -arthralgias, -myalgias, -joint swelling, -back pain Ophthalmology: -vision changes Urology: -dysuria, -difficulty urinating, -hematuria, -urinary frequency, -urgency Neurology: -headache,  -weakness, -tingling, -numbness       Objective:   Physical Exam BP 124/80   Pulse 75   Temp 97.7 F (36.5 C)   Ht 5\' 9"  (1.753 m)   Wt 168 lb 3.2 oz (76.3 kg)   BMI 24.84 kg/m   General Appearance:    Alert, cooperative, no distress, appears stated age  Head:    Normocephalic, without obvious abnormality, atraumatic  Eyes:    PERRL, conjunctiva/corneas clear, EOM's intact  Ears:    Normal TM's and external ear canals  Nose:   Mask in place   Throat:   Mask in place   Neck:   Supple, no lymphadenopathy;  Small right thyroid nodule without enlargement/tenderness; no JVD  Back:    Spine nontender, no curvature, ROM normal, no CVA     tenderness  Lungs:     Clear to auscultation bilaterally without wheezes, rales or     ronchi; respirations unlabored  Chest Wall:    No tenderness or deformity   Heart:    Regular rate and rhythm, S1 and S2 normal, no murmur, rub   or gallop  Breast Exam:    OB/GYN  Abdomen:     Soft, non-tender, nondistended, normoactive bowel sounds,    no masses, no hepatosplenomegaly  Genitalia:    OB/GYN     Extremities:   No clubbing, cyanosis or edema  Pulses:   2+ and symmetric all extremities  Skin:   Skin color, texture, turgor normal, no rashes or lesions  Lymph nodes:   Cervical, supraclavicular, and axillary nodes normal  Neurologic:   CNII-XII intact, normal strength, sensation and gait; reflexes  2+ and symmetric throughout          Psych:   Normal mood, affect, hygiene and grooming.         Assessment & Plan:  Routine general medical examination at a health care facility - Plan: CBC with Differential/Platelet, Comprehensive metabolic panel, TSH, T4, free, T3 -She is here today for fasting CPE.  Counseling on healthy lifestyle including diet and exercise.  She sees her OB/GYN.  Reviewed preventive health care.  She will call and schedule her mammogram.  Immunizations up-to-date.  Discussed safety and health promotion.  Prediabetes - Plan:  Hemoglobin A1c -We will check A1c and follow-up.  Mild persistent asthma, uncomplicated -She is under the care of her allergist and asthma specialist  Essential hypertension - Plan: triamterene-hydrochlorothiazide (MAXZIDE-25) 37.5-25 MG tablet -Blood pressure controlled.  Continue on current medication  Elevated LDL cholesterol level - Plan: Lipid panel -Check lipids and follow-up  Serum calcium elevated -follow up pending results   Vitamin D deficiency - Plan: VITAMIN D 25 Hydroxy (Vit-D Deficiency, Fractures)

## 2020-02-03 NOTE — Patient Instructions (Signed)

## 2020-02-04 ENCOUNTER — Other Ambulatory Visit: Payer: Self-pay

## 2020-02-04 ENCOUNTER — Ambulatory Visit (INDEPENDENT_AMBULATORY_CARE_PROVIDER_SITE_OTHER): Payer: Self-pay | Admitting: Family Medicine

## 2020-02-04 ENCOUNTER — Encounter: Payer: Self-pay | Admitting: Family Medicine

## 2020-02-04 VITALS — BP 124/80 | HR 75 | Temp 97.7°F | Ht 69.0 in | Wt 168.2 lb

## 2020-02-04 DIAGNOSIS — I10 Essential (primary) hypertension: Secondary | ICD-10-CM

## 2020-02-04 DIAGNOSIS — E7800 Pure hypercholesterolemia, unspecified: Secondary | ICD-10-CM

## 2020-02-04 DIAGNOSIS — E559 Vitamin D deficiency, unspecified: Secondary | ICD-10-CM

## 2020-02-04 DIAGNOSIS — Z0000 Encounter for general adult medical examination without abnormal findings: Secondary | ICD-10-CM

## 2020-02-04 DIAGNOSIS — J4530 Mild persistent asthma, uncomplicated: Secondary | ICD-10-CM

## 2020-02-04 DIAGNOSIS — R7303 Prediabetes: Secondary | ICD-10-CM

## 2020-02-04 MED ORDER — TRIAMTERENE-HCTZ 37.5-25 MG PO TABS: 1.0000 | ORAL_TABLET | Freq: Every day | ORAL | 5 refills | Status: DC

## 2020-02-05 LAB — VITAMIN D 25 HYDROXY (VIT D DEFICIENCY, FRACTURES): Vit D, 25-Hydroxy: 32.8 ng/mL (ref 30.0–100.0)

## 2020-02-05 LAB — COMPREHENSIVE METABOLIC PANEL
ALT: 18 IU/L (ref 0–32)
AST: 31 IU/L (ref 0–40)
Albumin/Globulin Ratio: 2.1 (ref 1.2–2.2)
Albumin: 4.8 g/dL (ref 3.8–4.9)
Alkaline Phosphatase: 74 IU/L (ref 39–117)
BUN/Creatinine Ratio: 10 (ref 9–23)
BUN: 8 mg/dL (ref 6–24)
Bilirubin Total: 0.3 mg/dL (ref 0.0–1.2)
CO2: 23 mmol/L (ref 20–29)
Calcium: 9.9 mg/dL (ref 8.7–10.2)
Chloride: 102 mmol/L (ref 96–106)
Creatinine, Ser: 0.77 mg/dL (ref 0.57–1.00)
GFR calc Af Amer: 99 mL/min/{1.73_m2} (ref >59)
GFR calc non Af Amer: 86 mL/min/{1.73_m2} (ref >59)
Globulin, Total: 2.3 g/dL (ref 1.5–4.5)
Glucose: 82 mg/dL (ref 65–99)
Potassium: 4.1 mmol/L (ref 3.5–5.2)
Sodium: 139 mmol/L (ref 134–144)
Total Protein: 7.1 g/dL (ref 6.0–8.5)

## 2020-02-05 LAB — CBC WITH DIFFERENTIAL/PLATELET
Basophils Absolute: 0 10*3/uL (ref 0.0–0.2)
Basos: 1 %
EOS (ABSOLUTE): 0.2 10*3/uL (ref 0.0–0.4)
Eos: 3 %
Hematocrit: 38.7 % (ref 34.0–46.6)
Hemoglobin: 13.9 g/dL (ref 11.1–15.9)
Immature Grans (Abs): 0 10*3/uL (ref 0.0–0.1)
Immature Granulocytes: 0 %
Lymphocytes Absolute: 2 10*3/uL (ref 0.7–3.1)
Lymphs: 38 %
MCH: 31.4 pg (ref 26.6–33.0)
MCHC: 35.9 g/dL — ABNORMAL HIGH (ref 31.5–35.7)
MCV: 88 fL (ref 79–97)
Monocytes Absolute: 0.3 10*3/uL (ref 0.1–0.9)
Monocytes: 6 %
Neutrophils Absolute: 2.7 10*3/uL (ref 1.4–7.0)
Neutrophils: 52 %
Platelets: 242 10*3/uL (ref 150–450)
RBC: 4.42 x10E6/uL (ref 3.77–5.28)
RDW: 13 % (ref 11.7–15.4)
WBC: 5.2 10*3/uL (ref 3.4–10.8)

## 2020-02-05 LAB — LIPID PANEL
Chol/HDL Ratio: 2.7 ratio (ref 0.0–4.4)
Cholesterol, Total: 222 mg/dL — ABNORMAL HIGH (ref 100–199)
HDL: 81 mg/dL (ref >39)
LDL Chol Calc (NIH): 128 mg/dL — ABNORMAL HIGH (ref 0–99)
Triglycerides: 77 mg/dL (ref 0–149)
VLDL Cholesterol Cal: 13 mg/dL (ref 5–40)

## 2020-02-05 LAB — HEMOGLOBIN A1C
Est. average glucose Bld gHb Est-mCnc: 120 mg/dL
Hgb A1c MFr Bld: 5.8 % — ABNORMAL HIGH (ref 4.8–5.6)

## 2020-02-05 LAB — T3: T3, Total: 94 ng/dL (ref 71–180)

## 2020-02-05 LAB — TSH: TSH: 0.791 u[IU]/mL (ref 0.450–4.500)

## 2020-02-05 LAB — T4, FREE: Free T4: 1.28 ng/dL (ref 0.82–1.77)

## 2020-02-11 ENCOUNTER — Other Ambulatory Visit: Payer: Self-pay | Admitting: Family Medicine

## 2020-02-11 DIAGNOSIS — I10 Essential (primary) hypertension: Secondary | ICD-10-CM

## 2020-03-22 LAB — HM COLONOSCOPY

## 2020-03-30 ENCOUNTER — Encounter: Payer: Self-pay | Admitting: Internal Medicine

## 2020-04-13 ENCOUNTER — Encounter: Payer: Self-pay | Admitting: Family Medicine

## 2020-08-03 ENCOUNTER — Telehealth: Payer: Self-pay | Admitting: Family Medicine

## 2020-08-03 DIAGNOSIS — I10 Essential (primary) hypertension: Secondary | ICD-10-CM

## 2020-08-03 MED ORDER — TRIAMTERENE-HCTZ 37.5-25 MG PO TABS: 1.0000 | ORAL_TABLET | Freq: Every day | ORAL | 2 refills | Status: DC

## 2020-08-03 NOTE — Telephone Encounter (Signed)
Sent in med

## 2020-08-03 NOTE — Telephone Encounter (Signed)
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 Please send to [place] [id number], [place], [place].

## 2020-08-05 ENCOUNTER — Encounter: Payer: Self-pay | Admitting: Family Medicine

## 2020-10-19 ENCOUNTER — Other Ambulatory Visit: Payer: Self-pay | Admitting: Family Medicine

## 2020-10-19 DIAGNOSIS — I10 Essential (primary) hypertension: Secondary | ICD-10-CM

## 2020-10-19 NOTE — Telephone Encounter (Signed)
Left message for pt to call back to schedule a follow-up appt with vickie that she is overdue

## 2020-10-22 ENCOUNTER — Other Ambulatory Visit: Payer: Self-pay

## 2020-10-22 ENCOUNTER — Encounter: Payer: Self-pay | Admitting: Family Medicine

## 2020-10-22 ENCOUNTER — Telehealth (INDEPENDENT_AMBULATORY_CARE_PROVIDER_SITE_OTHER): Payer: Self-pay | Admitting: Family Medicine

## 2020-10-22 VITALS — BP 122/72 | Wt 157.0 lb

## 2020-10-22 DIAGNOSIS — I10 Essential (primary) hypertension: Secondary | ICD-10-CM

## 2020-10-22 DIAGNOSIS — J4520 Mild intermittent asthma, uncomplicated: Secondary | ICD-10-CM

## 2020-10-22 DIAGNOSIS — E559 Vitamin D deficiency, unspecified: Secondary | ICD-10-CM

## 2020-10-22 DIAGNOSIS — E7800 Pure hypercholesterolemia, unspecified: Secondary | ICD-10-CM

## 2020-10-22 DIAGNOSIS — R7303 Prediabetes: Secondary | ICD-10-CM

## 2020-10-22 MED ORDER — ALBUTEROL SULFATE HFA 108 (90 BASE) MCG/ACT IN AERS: 2.0000 | INHALATION_SPRAY | RESPIRATORY_TRACT | 2 refills | Status: DC | PRN

## 2020-10-22 MED ORDER — TRIAMTERENE-HCTZ 37.5-25 MG PO TABS: 1.0000 | ORAL_TABLET | Freq: Every day | ORAL | 4 refills | Status: DC

## 2020-10-22 MED ORDER — BUDESONIDE-FORMOTEROL FUMARATE 80-4.5 MCG/ACT IN AERO: 2.0000 | INHALATION_SPRAY | Freq: Two times a day (BID) | RESPIRATORY_TRACT | 5 refills | Status: DC

## 2020-10-22 NOTE — Progress Notes (Signed)
° °  Subjective:  Documentation for virtual audio and video telecommunications through [place] encounter: The call had to be completed using telephone due to connectivity issue   The patient was located in [place]. 2 patient identifiers used.  The provider was located in the office. The patient did consent to this visit and is aware of possible charges through their insurance for this visit.  The other persons participating in this telemedicine service were none. Time spent on call was 10 minutes and in review of previous records >15 minutes total.  This virtual service is not related to other E/M service within previous 7 days.   Patient ID: Brittany Abbott, female    DOB: April 07, 1961, 59 y.o.   MRN: [id number]  HPI Chief Complaint  Patient presents with   med check    Med check.     This is a medication management visit. She is currently living in [place] on a short assignment at [employer]. She is near her children and doing very well. Plans to move back here in May 2022.   HTN- taking Maxzide-25 daily and no issues. Checks BP at home periodically and readings are fine.   States she has lost 10 lbs and is happy about this.   Asthma- using Symbicort as needed. Only had to use albuterol once.   Allergies- using Nasacort and this is working better than Flonase.    Vitamin D def- taking vitamin D daily 1,000 IUs.    Denies fever, chills, dizziness, chest pain, palpitations, shortness of breath, abdominal pain, N/V/D.   Reviewed allergies, medications, past medical, surgical, family, and social history.   Review of Systems Pertinent positives and negatives in the history of present illness.     Objective:   Physical Exam BP 122/72    Wt 157 lb (71.2 kg)    BMI 23.18 kg/m   Alert and oriented and in no acute distress.  Speaking in complete sentences without difficulty.  Normal speech, mood and thought process.      Assessment & Plan:  Essential hypertension -  Plan: triamterene-hydrochlorothiazide (MAXZIDE-25) 37.5-25 MG tablet  Mild intermittent asthma without complication - Plan: albuterol (VENTOLIN HFA) 108 (90 Base) MCG/ACT inhaler, budesonide-formoterol (SYMBICORT) 80-4.5 MCG/ACT inhaler  Prediabetes  Elevated LDL cholesterol level  Vitamin D deficiency  Appears to be in her usual state of health.  Currently living in [place] working at [employer] until May and then she will move back here.  Reports doing well on her medications and no new concerns. Blood pressure has been in goal range.  She will continue her current medication and continue with a healthy diet and staying physically active.  Asthma controlled.  Refill medications. Continue with healthy diet and exercise for her prediabetes and elevated LDL. Continue with vitamin D supplement. She will call and schedule a CPE, fasting, and may when she returns and we will check labs at that time.

## 2020-12-22 ENCOUNTER — Encounter: Payer: Self-pay | Admitting: Family Medicine

## 2020-12-22 ENCOUNTER — Other Ambulatory Visit: Payer: Self-pay

## 2020-12-22 ENCOUNTER — Telehealth (INDEPENDENT_AMBULATORY_CARE_PROVIDER_SITE_OTHER): Payer: Managed Care, Other (non HMO) | Admitting: Family Medicine

## 2020-12-22 VITALS — Wt 157.0 lb

## 2020-12-22 DIAGNOSIS — G4700 Insomnia, unspecified: Secondary | ICD-10-CM

## 2020-12-22 DIAGNOSIS — F419 Anxiety disorder, unspecified: Secondary | ICD-10-CM | POA: Diagnosis not present

## 2020-12-22 DIAGNOSIS — R41840 Attention and concentration deficit: Secondary | ICD-10-CM | POA: Diagnosis not present

## 2020-12-22 MED ORDER — ALPRAZOLAM 0.25 MG PO TABS: 0.2500 mg | ORAL_TABLET | Freq: Two times a day (BID) | ORAL | 0 refills | Status: DC | PRN

## 2020-12-22 MED ORDER — SERTRALINE HCL 50 MG PO TABS: 50.0000 mg | ORAL_TABLET | Freq: Every day | ORAL | 2 refills | Status: DC

## 2020-12-22 NOTE — Progress Notes (Signed)
Subjective:  Documentation for virtual audio and video telecommunications through [place] encounter:  The patient was located at home. 2 patient identifiers used.  The provider was located in the office. The patient did consent to this visit and is aware of possible charges through their insurance for this visit.  The other persons participating in this telemedicine service were none. Time spent on call was 28 minutes and in review of previous records 33 minutes total.  This virtual service is not related to other E/M service within previous 7 days.   Patient ID: Brittany Abbott, female    DOB: 1961-07-15, 60 y.o.   MRN: [id number]  HPI Chief Complaint  Patient presents with  . discuss aniexty and ADHD    Discuss anxiety and ADHD   She is currently living and working in [place], [place]. This is a virtual visit to discuss worsening anxiety.  Reports over the past few weeks to months she has been dealing with anxiety, trouble sleeping and has been trying to work through this on her own.  She is quite knowledgeable and self-aware and decided it is time to reach out for help.  She recalls taking medication in the 1990s for anxiety.  She also had a therapist at that time as well.  States she had situational anxiety and depression at that time.  She eventually was able to manage with coping mechanisms which she has also been able to use over the past several years. States her coping mechanisms are no longer enough to help her manage her current symptoms.  Reports being under a great deal of stress with her current job and is wearing many hats.  States she "knows I have ADHD". 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  States she has never had a official diagnosis but is aware that often anxiety, depression and ADHD can look a lot like.   She is currently working at [employer] but states this is temporary and she does plan to move back to  [place].  States she has tried melatonin for sleep but it makes her feel too "groggy".   Denies self-medicating with alcohol or drugs.  States she often works until 2 AM. Denies SI.  Denies fever, chills, dizziness, chest pain, palpitations, shortness of breath, abdominal pain, N/V/D, urinary symptoms, LE edema.      GAD 7 : Generalized Anxiety Score 12/22/2020  Nervous, Anxious, on Edge 3  Control/stop worrying 1  Worry too much - different things 1  Trouble relaxing 2  Restless 3  Easily annoyed or irritable 1  Afraid - awful might happen 0  Total GAD 7 Score 11  Anxiety Difficulty Not difficult at all       Review of Systems Pertinent positives and negatives in the history of present illness.     Objective:   Physical Exam Wt 157 lb (71.2 kg)   BMI 23.18 kg/m   Alert and oriented and in no acute distress.  Respirations unlabored.  Normal speech, mood, thought process and memory.       Assessment & Plan:  Anxiety - Plan: sertraline (ZOLOFT) 50 MG tablet, ALPRAZolam (XANAX) 0.25 MG tablet, DISCONTINUED: sertraline (ZOLOFT) 50 MG tablet, DISCONTINUED: ALPRAZolam (XANAX) 0.25 MG tablet  Insomnia, unspecified type - Plan: sertraline (ZOLOFT) 50 MG tablet, ALPRAZolam (XANAX) 0.25 MG tablet, DISCONTINUED: sertraline (ZOLOFT) 50 MG tablet, DISCONTINUED: ALPRAZolam (XANAX) 0.25 MG tablet  Attention and concentration deficit - Plan: sertraline (  ZOLOFT) 50 MG tablet, DISCONTINUED: sertraline (ZOLOFT) 50 MG tablet  Congratulated her on being self-aware and reaching out.  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  We discussed options for medication for anxiety and other symptoms.  She is aware of sertraline and would prefer to try this medication which I am fine with.  She will start on 1/2 tablet for the first week and is long as she is doing well she may increase to the full tablet week 2.  Discussed potential side effects.  I will  also prescribe Xanax for her to use as needed for panic attacks.  Discussed this is meant for short-term use only and is not meant to be taken on a regular basis.  Discussed that often just having the medication on hand is very calming and reassuring.  Encouraged her to seek a mental health specialist to help with ADHD screening.  Discussed that issues with concentration and attention may improve by treating her anxiety also.  I also expect her sleep issues to improve once we get her anxiety under better control.  I also recommend discussing sleep hygiene with a counselor.  We will do a virtual follow-up in 2 weeks or sooner if she needs.

## 2020-12-30 ENCOUNTER — Institutional Professional Consult (permissible substitution): Payer: Self-pay | Admitting: Family Medicine

## 2021-01-05 ENCOUNTER — Encounter: Payer: Self-pay | Admitting: Family Medicine

## 2021-01-05 ENCOUNTER — Other Ambulatory Visit: Payer: Self-pay

## 2021-01-05 ENCOUNTER — Telehealth (INDEPENDENT_AMBULATORY_CARE_PROVIDER_SITE_OTHER): Payer: Managed Care, Other (non HMO) | Admitting: Family Medicine

## 2021-01-05 VITALS — Temp 97.6°F | Wt 157.0 lb

## 2021-01-05 DIAGNOSIS — R41840 Attention and concentration deficit: Secondary | ICD-10-CM | POA: Diagnosis not present

## 2021-01-05 DIAGNOSIS — F419 Anxiety disorder, unspecified: Secondary | ICD-10-CM | POA: Diagnosis not present

## 2021-01-05 DIAGNOSIS — G4700 Insomnia, unspecified: Secondary | ICD-10-CM

## 2021-01-05 NOTE — Progress Notes (Signed)
   Subjective:  Documentation for virtual audio and video telecommunications through [place] encounter:  The patient was located at home. 2 patient identifiers used.  The provider was located in the office. The patient did consent to this visit and is aware of possible charges through their insurance for this visit.  The other persons participating in this telemedicine service were none. Time spent on call was 16 minutes and in review of previous records 20 minutes total.  This virtual service is not related to other E/M service within previous 7 days.   Patient ID: Brittany Abbott, female    DOB: Jun 01, 1961, 60 y.o.   MRN: [id number]  HPI Chief Complaint  Patient presents with  . follow up anxiety     Has improved . Doing fine on new meds   This is a 2 week follow up on starting on sertraline and alprazolam for anxiety.  Reports doing well now and noticing a positive change in her ability to focus.  States she did not realize that there was a component of depression and now she is having more motivation, easier to get up in the morning and start her day. She did unfortunately have a couple of days of side effects which have resolved.   States she is taking alprazolam sparingly. Occasionally for sleep.  States she has 8 out of 20 tablets left.  States she might request a refill if she is still needing it.  Has not yet seen a therapist but is still working on scheduling.    Review of Systems Pertinent positives and negatives in the history of present illness.     Objective:   Physical Exam Temp 97.6 F (36.4 C)   Wt 157 lb (71.2 kg)   BMI 23.18 kg/m   Alert and oriented in no acute distress.  Normal appearance, smiling, respirations unlabored.  Normal speech and mood.      Assessment & Plan:  Anxiety  Insomnia, unspecified type  Attention and concentration deficit  She feels that the sertraline is starting to have positive effects on her mood and side effects have  resolved.  She will continue on the medication.  Taking alprazolam sparingly and I did counsel her on the fact that this medication should not be used nightly for sleep because it can mess with her sleep architecture.  I will provide her with a refill if she needs it but she is aware that I will not continue to keep her on this medication in the future.  Recommend she schedule with a therapist.

## 2021-01-14 ENCOUNTER — Other Ambulatory Visit: Payer: Self-pay | Admitting: Family Medicine

## 2021-01-14 DIAGNOSIS — F419 Anxiety disorder, unspecified: Secondary | ICD-10-CM

## 2021-01-14 DIAGNOSIS — G4700 Insomnia, unspecified: Secondary | ICD-10-CM

## 2021-01-14 NOTE — Telephone Encounter (Signed)
[facility] pt xanax. Please advise [facility]

## 2021-03-15 ENCOUNTER — Other Ambulatory Visit: Payer: Self-pay | Admitting: Family Medicine

## 2021-03-15 DIAGNOSIS — I10 Essential (primary) hypertension: Secondary | ICD-10-CM

## 2021-03-23 ENCOUNTER — Other Ambulatory Visit: Payer: Self-pay | Admitting: Family Medicine

## 2021-03-23 DIAGNOSIS — F419 Anxiety disorder, unspecified: Secondary | ICD-10-CM

## 2021-03-23 DIAGNOSIS — G4700 Insomnia, unspecified: Secondary | ICD-10-CM

## 2021-03-23 DIAGNOSIS — R41840 Attention and concentration deficit: Secondary | ICD-10-CM

## 2021-03-24 NOTE — Telephone Encounter (Signed)
Is this okay to refill? 

## 2021-03-26 ENCOUNTER — Other Ambulatory Visit: Payer: Self-pay | Admitting: Family Medicine

## 2021-03-26 DIAGNOSIS — G4700 Insomnia, unspecified: Secondary | ICD-10-CM

## 2021-03-26 DIAGNOSIS — R41840 Attention and concentration deficit: Secondary | ICD-10-CM

## 2021-03-26 DIAGNOSIS — F419 Anxiety disorder, unspecified: Secondary | ICD-10-CM

## 2021-04-20 ENCOUNTER — Other Ambulatory Visit: Payer: Self-pay | Admitting: Family Medicine

## 2021-04-20 DIAGNOSIS — F419 Anxiety disorder, unspecified: Secondary | ICD-10-CM

## 2021-04-20 DIAGNOSIS — G4700 Insomnia, unspecified: Secondary | ICD-10-CM

## 2021-04-20 DIAGNOSIS — R41840 Attention and concentration deficit: Secondary | ICD-10-CM

## 2021-04-20 NOTE — Telephone Encounter (Signed)
[facility] is requesting to fill pt zoloft. Please advise.kh

## 2021-04-25 ENCOUNTER — Other Ambulatory Visit: Payer: Self-pay | Admitting: Family Medicine

## 2021-04-25 DIAGNOSIS — F419 Anxiety disorder, unspecified: Secondary | ICD-10-CM

## 2021-04-25 DIAGNOSIS — G4700 Insomnia, unspecified: Secondary | ICD-10-CM

## 2021-04-26 NOTE — Telephone Encounter (Signed)
[facility] is requesting to fill pt xanax. Please advise [facility]

## 2021-06-09 ENCOUNTER — Other Ambulatory Visit: Payer: Self-pay | Admitting: Family Medicine

## 2021-06-09 DIAGNOSIS — I10 Essential (primary) hypertension: Secondary | ICD-10-CM

## 2021-06-21 ENCOUNTER — Other Ambulatory Visit: Payer: Self-pay | Admitting: Family Medicine

## 2021-06-21 DIAGNOSIS — J4520 Mild intermittent asthma, uncomplicated: Secondary | ICD-10-CM

## 2021-07-19 ENCOUNTER — Other Ambulatory Visit: Payer: Self-pay | Admitting: Family Medicine

## 2021-07-19 DIAGNOSIS — F419 Anxiety disorder, unspecified: Secondary | ICD-10-CM

## 2021-07-19 DIAGNOSIS — G4700 Insomnia, unspecified: Secondary | ICD-10-CM

## 2021-07-19 DIAGNOSIS — R41840 Attention and concentration deficit: Secondary | ICD-10-CM

## 2021-07-20 NOTE — Telephone Encounter (Signed)
[facility] is requesting to fill pt zoloft please advise [facility]

## 2021-07-21 ENCOUNTER — Other Ambulatory Visit: Payer: Self-pay | Admitting: Family Medicine

## 2021-07-21 ENCOUNTER — Encounter: Payer: Self-pay | Admitting: Family Medicine

## 2021-07-21 ENCOUNTER — Telehealth: Payer: Managed Care, Other (non HMO) | Admitting: Family Medicine

## 2021-07-21 ENCOUNTER — Other Ambulatory Visit: Payer: Self-pay

## 2021-07-21 VITALS — Temp 97.8°F | Wt 165.0 lb

## 2021-07-21 DIAGNOSIS — J4520 Mild intermittent asthma, uncomplicated: Secondary | ICD-10-CM

## 2021-07-21 DIAGNOSIS — F419 Anxiety disorder, unspecified: Secondary | ICD-10-CM

## 2021-07-21 DIAGNOSIS — I10 Essential (primary) hypertension: Secondary | ICD-10-CM | POA: Diagnosis not present

## 2021-07-21 DIAGNOSIS — J3089 Other allergic rhinitis: Secondary | ICD-10-CM | POA: Diagnosis not present

## 2021-07-21 MED ORDER — VITAMIN D 25 MCG (1000 UNIT) PO TABS: 1000.0000 [IU] | ORAL_TABLET | Freq: Every day | ORAL | 2 refills | Status: AC

## 2021-07-21 MED ORDER — AZELASTINE HCL 0.1 % NA SOLN: 2.0000 | Freq: Two times a day (BID) | NASAL | 0 refills | Status: DC

## 2021-07-21 MED ORDER — SERTRALINE HCL 50 MG PO TABS: 50.0000 mg | ORAL_TABLET | Freq: Every day | ORAL | 0 refills | Status: DC

## 2021-07-21 MED ORDER — ALBUTEROL SULFATE HFA 108 (90 BASE) MCG/ACT IN AERS: 2.0000 | INHALATION_SPRAY | RESPIRATORY_TRACT | 1 refills | Status: AC | PRN

## 2021-07-21 MED ORDER — TRIAMTERENE-HCTZ 37.5-25 MG PO TABS
1.0000 | ORAL_TABLET | Freq: Every day | ORAL | 0 refills | Status: DC
Start: 2021-07-21 — End: 2021-11-02

## 2021-07-21 MED ORDER — BUDESONIDE-FORMOTEROL FUMARATE 160-4.5 MCG/ACT IN AERO: 2.0000 | INHALATION_SPRAY | Freq: Two times a day (BID) | RESPIRATORY_TRACT | 2 refills | Status: DC

## 2021-07-21 NOTE — Progress Notes (Signed)
   Subjective:  Documentation for virtual audio and video telecommunications through [place] encounter:  The patient was located in her car. 2 patient identifiers used.  The provider was located at home. The patient did consent to this visit and is aware of possible charges through their insurance for this visit.  The other persons participating in this telemedicine service were none. Time spent on call was 15 minutes and in review of previous records >20 minutes total.  This virtual service is not related to other E/M service within previous 7 days.   Patient ID: Brittany Abbott, female    DOB: 01-Aug-1961, 60 y.o.   MRN: [id number]  HPI Chief Complaint  Patient presents with   other    Med check    She is living in [place]. 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 Will get a new PCP soon.   Allergies are bothersome. Taking Zyrtec and using Nasocort regularly.    Asthma flares ups more lately. She is not taking Symbicort, out of refills so she is needing albuterol more often.   Anxiety- taking sertraline. New job with stress and thinks it is working well for her. Wants to continue on sertraline.   She is not needing alprazolam.   HTN- BP at home 122/78. Taking Maxzide-25 daily. No concerns   Dr. Carlota Raspberry- her OB/GYN, states she had labs done there in January   Denies fever, chills, dizziness, chest pain, palpitations, shortness of breath, abdominal pain, N/V/D, urinary symptoms, LE edema.     Review of Systems Pertinent positives and negatives in the history of present illness.     Objective:   Physical Exam Temp 97.8 F (36.6 C)   Wt 165 lb (74.8 kg)   BMI 24.37 kg/m   Alert and oriented in no acute distress.  Respirations unlabored.  Normal speech and mood.      Assessment & Plan:  Mild intermittent asthma without complication - Plan: albuterol (VENTOLIN HFA) 108 (90 Base) MCG/ACT inhaler, budesonide-formoterol (SYMBICORT) 160-4.5 MCG/ACT inhaler -She  will work on controlling her allergies, may switch to Xyzal.  More issues with asthma recently due to being out of Symbicort.  I will refill Symbicort and she will start taking this daily.  Hopefully this will allow her to use albuterol less often. She will find a new PCP in [place] where she now lives.  Non-seasonal allergic rhinitis due to other allergic trigger - Plan: azelastine (ASTELIN) 0.1 % nasal spray -Continue treating allergies  Essential hypertension - Plan: triamterene-hydrochlorothiazide (MAXZIDE-25) 37.5-25 MG tablet -Reportedly her blood pressure is controlled.  I have not seen her in the office in over a year.  She is aware that she needs to keep an eye on her kidney function and she reports having labs recently at her OB/GYN office.  I will refill hypertension medication for 3 months and she will find a new PCP where she is living in [place].  Anxiety - Plan: sertraline (ZOLOFT) 50 MG tablet -Controlled and will continue on sertraline.  39-monthrefills provided and I wished her the best in her new job.  It has been a pleasure being her PCP.

## 2021-07-24 ENCOUNTER — Telehealth: Payer: Self-pay

## 2021-07-24 NOTE — Telephone Encounter (Signed)
P.A. BUDESONDIE FORMOTEROL

## 2021-07-26 NOTE — Telephone Encounter (Signed)
Symbicort not covered can pt be switched to covered alternative Dulera, Flovent, Advair, Bevespi, Breo Pulmicort or Trelegy ?

## 2021-07-31 MED ORDER — FLUTICASONE-SALMETEROL 100-50 MCG/ACT IN AEPB: 1.0000 | INHALATION_SPRAY | Freq: Two times a day (BID) | RESPIRATORY_TRACT | 2 refills | Status: DC

## 2021-07-31 NOTE — Telephone Encounter (Signed)
Tried calling pharmacy but closed today, sent in Advair as alternative, sent pt mychart message

## 2021-11-02 ENCOUNTER — Telehealth: Payer: Self-pay | Admitting: Family Medicine

## 2021-11-02 ENCOUNTER — Other Ambulatory Visit: Payer: Self-pay

## 2021-11-02 DIAGNOSIS — I10 Essential (primary) hypertension: Secondary | ICD-10-CM

## 2021-11-02 DIAGNOSIS — J3089 Other allergic rhinitis: Secondary | ICD-10-CM

## 2021-11-02 MED ORDER — FLUTICASONE-SALMETEROL 100-50 MCG/ACT IN AEPB: 1.0000 | INHALATION_SPRAY | Freq: Two times a day (BID) | RESPIRATORY_TRACT | 1 refills | Status: AC

## 2021-11-02 MED ORDER — AZELASTINE HCL 0.1 % NA SOLN: 2.0000 | Freq: Two times a day (BID) | NASAL | 0 refills | Status: AC

## 2021-11-02 MED ORDER — TRIAMTERENE-HCTZ 37.5-25 MG PO TABS: 1.0000 | ORAL_TABLET | Freq: Every day | ORAL | 0 refills | Status: AC

## 2021-11-02 NOTE — Telephone Encounter (Signed)
[facility] sent refill for  Triamterent-hctz Advair Azelastine nasal soultion Please send to the [place], [place]

## 2021-11-03 NOTE — Telephone Encounter (Signed)
Prescriptions already sent yesterday.

## 2021-11-28 ENCOUNTER — Telehealth: Payer: Self-pay

## 2021-11-28 ENCOUNTER — Other Ambulatory Visit: Payer: Self-pay | Admitting: Medical

## 2021-11-28 DIAGNOSIS — F419 Anxiety disorder, unspecified: Secondary | ICD-10-CM

## 2021-11-28 MED ORDER — SERTRALINE HCL 50 MG PO TABS: 50.0000 mg | ORAL_TABLET | Freq: Every day | ORAL | 0 refills | Status: DC

## 2021-11-28 NOTE — Telephone Encounter (Signed)
Received fax from [facility] for a refill on the pts. Zoloft last apt 07/21/21.

## 2022-03-12 ENCOUNTER — Other Ambulatory Visit: Payer: Self-pay | Admitting: Medical

## 2022-03-12 DIAGNOSIS — F419 Anxiety disorder, unspecified: Secondary | ICD-10-CM

## 2022-03-15 ENCOUNTER — Encounter: Payer: Self-pay | Admitting: Physician Assistant

## 2022-04-22 ENCOUNTER — Other Ambulatory Visit: Payer: Self-pay | Admitting: Physician Assistant

## 2022-04-22 DIAGNOSIS — F419 Anxiety disorder, unspecified: Secondary | ICD-10-CM

## 2022-05-05 ENCOUNTER — Encounter: Payer: Self-pay | Admitting: Internal Medicine

## 2022-08-15 ENCOUNTER — Encounter: Payer: Self-pay | Admitting: Internal Medicine

## 2022-09-19 ENCOUNTER — Encounter: Payer: Self-pay | Admitting: Internal Medicine

## 2023-05-11 ENCOUNTER — Other Ambulatory Visit: Payer: Self-pay | Admitting: Family Medicine
